# Patient Record
Sex: Male | Born: 1940 | Race: Black or African American | Hispanic: No | Marital: Single | Smoking: Current every day smoker
Health system: Southern US, Community
[De-identification: ages and names within clinical notes are randomized; demographics above are authoritative.]

## PROBLEM LIST (undated history)

## (undated) DIAGNOSIS — F32A Depression, unspecified: Secondary | ICD-10-CM

## (undated) DIAGNOSIS — K219 Gastro-esophageal reflux disease without esophagitis: Secondary | ICD-10-CM

## (undated) DIAGNOSIS — F0390 Unspecified dementia without behavioral disturbance: Secondary | ICD-10-CM

## (undated) DIAGNOSIS — I10 Essential (primary) hypertension: Secondary | ICD-10-CM

## (undated) DIAGNOSIS — F329 Major depressive disorder, single episode, unspecified: Secondary | ICD-10-CM

## (undated) HISTORY — PX: FACIAL COSMETIC SURGERY: SHX629

---

## 2016-07-19 ENCOUNTER — Emergency Department: Payer: Non-veteran care

## 2016-07-19 ENCOUNTER — Encounter: Payer: Self-pay | Admitting: Emergency Medicine

## 2016-07-19 ENCOUNTER — Emergency Department
Admission: EM | Admit: 2016-07-19 | Discharge: 2016-07-20 | Disposition: A | Discharge status: Home / Self Care | Payer: Non-veteran care | Attending: Emergency Medicine | Admitting: Emergency Medicine

## 2016-07-19 DIAGNOSIS — I10 Essential (primary) hypertension: Secondary | ICD-10-CM | POA: Insufficient documentation

## 2016-07-19 DIAGNOSIS — F17210 Nicotine dependence, cigarettes, uncomplicated: Secondary | ICD-10-CM | POA: Insufficient documentation

## 2016-07-19 DIAGNOSIS — R112 Nausea with vomiting, unspecified: Secondary | ICD-10-CM | POA: Insufficient documentation

## 2016-07-19 DIAGNOSIS — R55 Syncope and collapse: Secondary | ICD-10-CM | POA: Insufficient documentation

## 2016-07-19 HISTORY — DX: Major depressive disorder, single episode, unspecified: F32.9

## 2016-07-19 HISTORY — DX: Essential (primary) hypertension: I10

## 2016-07-19 HISTORY — DX: Depression, unspecified: F32.A

## 2016-07-19 HISTORY — DX: Gastro-esophageal reflux disease without esophagitis: K21.9

## 2016-07-19 HISTORY — DX: Unspecified dementia, unspecified severity, without behavioral disturbance, psychotic disturbance, mood disturbance, and anxiety: F03.90

## 2016-07-19 LAB — INFLUENZA PANEL BY PCR (TYPE A & B)
INFLAPCR: NEGATIVE
INFLBPCR: NEGATIVE

## 2016-07-19 LAB — CBC WITH DIFFERENTIAL/PLATELET
BASOS ABS: 0 10*3/uL (ref 0–0.1)
BASOS PCT: 1 %
EOS ABS: 0.1 10*3/uL (ref 0–0.7)
EOS PCT: 1 %
HCT: 37.1 % — ABNORMAL LOW (ref 40.0–52.0)
Hemoglobin: 12.2 g/dL — ABNORMAL LOW (ref 13.0–18.0)
LYMPHS PCT: 26 %
Lymphs Abs: 2.2 10*3/uL (ref 1.0–3.6)
MCH: 28.2 pg (ref 26.0–34.0)
MCHC: 32.9 g/dL (ref 32.0–36.0)
MCV: 85.6 fL (ref 80.0–100.0)
Monocytes Absolute: 0.7 10*3/uL (ref 0.2–1.0)
Monocytes Relative: 9 %
Neutro Abs: 5.3 10*3/uL (ref 1.4–6.5)
Neutrophils Relative %: 63 %
PLATELETS: 220 10*3/uL (ref 150–440)
RBC: 4.34 MIL/uL — AB (ref 4.40–5.90)
RDW: 13.1 % (ref 11.5–14.5)
WBC: 8.4 10*3/uL (ref 3.8–10.6)

## 2016-07-19 LAB — COMPREHENSIVE METABOLIC PANEL
ALBUMIN: 4 g/dL (ref 3.5–5.0)
ALT: 15 U/L — AB (ref 17–63)
AST: 22 U/L (ref 15–41)
Alkaline Phosphatase: 60 U/L (ref 38–126)
Anion gap: 5 (ref 5–15)
BUN: 16 mg/dL (ref 6–20)
CHLORIDE: 107 mmol/L (ref 101–111)
CO2: 27 mmol/L (ref 22–32)
CREATININE: 1.64 mg/dL — AB (ref 0.61–1.24)
Calcium: 8.4 mg/dL — ABNORMAL LOW (ref 8.9–10.3)
GFR calc Af Amer: 46 mL/min — ABNORMAL LOW (ref >60)
GFR calc non Af Amer: 40 mL/min — ABNORMAL LOW (ref >60)
Glucose, Bld: 107 mg/dL — ABNORMAL HIGH (ref 65–99)
Potassium: 3.8 mmol/L (ref 3.5–5.1)
SODIUM: 139 mmol/L (ref 135–145)
Total Bilirubin: 0.4 mg/dL (ref 0.3–1.2)
Total Protein: 7.6 g/dL (ref 6.5–8.1)

## 2016-07-19 LAB — URINALYSIS, COMPLETE (UACMP) WITH MICROSCOPIC
BACTERIA UA: NONE SEEN
BILIRUBIN URINE: NEGATIVE
Glucose, UA: NEGATIVE mg/dL
KETONES UR: NEGATIVE mg/dL
Nitrite: NEGATIVE
PH: 5 (ref 5.0–8.0)
Protein, ur: 30 mg/dL — AB
Specific Gravity, Urine: 1.014 (ref 1.005–1.030)

## 2016-07-19 LAB — TROPONIN I: Troponin I: 0.03 ng/mL (ref <0.03)

## 2016-07-19 LAB — LIPASE, BLOOD: Lipase: 33 U/L (ref 11–51)

## 2016-07-19 MED ORDER — SODIUM CHLORIDE 0.9 % IV BOLUS (SEPSIS)
1000.0000 mL | Freq: Once | INTRAVENOUS | Status: AC
  Administered 2016-07-19: 1000 mL via INTRAVENOUS

## 2016-07-19 MED ORDER — CLONIDINE HCL 0.1 MG PO TABS
ORAL_TABLET | ORAL | Status: AC
  Administered 2016-07-19: 0.1 mg via ORAL
  Filled 2016-07-19: qty 1

## 2016-07-19 MED ORDER — CLONIDINE HCL 0.1 MG PO TABS
0.1000 mg | ORAL_TABLET | Freq: Once | ORAL | Status: AC
  Administered 2016-07-19: 0.1 mg via ORAL

## 2016-07-19 MED ORDER — ONDANSETRON HCL 4 MG/2ML IJ SOLN
4.0000 mg | Freq: Once | INTRAMUSCULAR | Status: AC
  Administered 2016-07-19: 4 mg via INTRAVENOUS
  Filled 2016-07-19: qty 2

## 2016-07-19 NOTE — ED Provider Notes (Signed)
[facility] Emergency Department Provider Note  ____________________________________________   First MD Initiated Contact with Patient 07/19/16 2126     (approximate)  I have reviewed the triage vital signs and the nursing notes.   HISTORY  Chief Complaint Near Syncope   HPI Seth Harrison is a 76 y.o. male with a history of dementia and hypertension who is presenting after near syncopal episode and vomiting. The patient said that he was standing when he felt like he was going to pass out. He said that he fell on his bottom but is denying any pain. Denies any headache. Said that he vomited soon after. Does not report any blood in his vomitus. No diarrhea. Denies any pain at this time.   Past Medical History:  Diagnosis Date  . Dementia   . Depression   . GERD (gastroesophageal reflux disease)   . Hypertension     There are no active problems to display for this patient.   Past Surgical History:  Procedure Laterality Date  . FACIAL COSMETIC SURGERY      Prior to Admission medications   Not on File    Allergies Patient has no known allergies.  No family history on file.  Social History Social History  Substance Use Topics  . Smoking status: Current Every Day Smoker    Types: Cigarettes  . Smokeless tobacco: Never Used  . Alcohol use No    Review of Systems Constitutional: No fever/chills Eyes: No visual changes. ENT: No sore throat. Cardiovascular: Denies chest pain. Respiratory: Denies shortness of breath. Gastrointestinal: No abdominal pain.    No diarrhea.  No constipation. Genitourinary: Negative for dysuria. Musculoskeletal: Negative for back pain. Skin: Negative for rash. Neurological: Negative for headaches, focal weakness or numbness.  10-point ROS otherwise negative.  ____________________________________________   PHYSICAL EXAM:  VITAL SIGNS: ED Triage Vitals  Enc Vitals Group     BP 07/19/16 2132 (!)  205/112     Pulse Rate 07/19/16 2132 74     Resp 07/19/16 2132 17     Temp 07/19/16 2132 98 F (36.7 C)     Temp Source 07/19/16 2132 Oral     SpO2 07/19/16 2132 99 %     Weight 07/19/16 2126 160 lb (72.6 kg)     Height 07/19/16 2126 5\' 8"  (1.727 m)     Head Circumference --      Peak Flow --      Pain Score --      Pain Loc --      Pain Edu? --      Excl. in GC? --     Constitutional: Alert and oriented. Well appearing and in no acute distress. Eyes: Conjunctivae are normal. PERRL. EOMI. Head: Atraumatic. Nose: No congestion/rhinnorhea. Mouth/Throat: Mucous membranes are moist.   Neck: No stridor.   Cardiovascular: Normal rate, regular rhythm. Grossly normal heart sounds.   Respiratory: Normal respiratory effort.  No retractions. Lungs CTAB. Gastrointestinal: Soft and nontender. No distention. No abdominal bruits. No CVA tenderness. Musculoskeletal: No lower extremity tenderness nor edema.  No joint effusions. Neurologic:  Normal speech and language. No gross focal neurologic deficits are appreciated. No gait instability. Skin:  Skin is warm, dry and intact. No rash noted. Psychiatric:  Speech and behavior are normal.  ____________________________________________   LABS (all labs ordered are listed, but only abnormal results are displayed)  Labs Reviewed  CBC WITH DIFFERENTIAL/PLATELET - Abnormal; Notable for the following:       Result  Value   RBC 4.34 (*)    Hemoglobin 12.2 (*)    HCT 37.1 (*)    All other components within normal limits  COMPREHENSIVE METABOLIC PANEL - Abnormal; Notable for the following:    Glucose, Bld 107 (*)    Creatinine, Ser 1.64 (*)    Calcium 8.4 (*)    ALT 15 (*)    GFR calc non Af Amer 40 (*)    GFR calc Af Amer 46 (*)    All other components within normal limits  TROPONIN I - Abnormal; Notable for the following:    Troponin I 0.03 (*)    All other components within normal limits  URINALYSIS, COMPLETE (UACMP) WITH MICROSCOPIC -  Abnormal; Notable for the following:    Color, Urine YELLOW (*)    APPearance CLEAR (*)    Hgb urine dipstick SMALL (*)    Protein, ur 30 (*)    Leukocytes, UA TRACE (*)    Squamous Epithelial / LPF 0-5 (*)    All other components within normal limits  LIPASE, BLOOD  INFLUENZA PANEL BY PCR (TYPE A & B)  TROPONIN I   ____________________________________________  EKG  ED ECG REPORT I, Arelia Longest, the attending physician, personally viewed and interpreted this ECG.   Date: 07/19/2016  EKG Time: 2133  Rate: 62  Rhythm: normal sinus rhythm  Axis: normal axis  Intervals:none  ST&T Change: Minimal elevation in the anterior leads without any reciprocal depression. No T-wave inversions that are abnormal. No previous for comparison. ____________________________________________  RADIOLOGY  DG Chest 1 View (Accession [id number]) (Order [id number])  Imaging  Date: 07/19/2016 Department: [facility] EMERGENCY DEPARTMENT Released By/Authorizing: Myrna Blazer, MD (auto-released)  Exam Information   Status Exam Begun  Exam Ended   Final [99] 07/19/2016 9:37 PM 07/19/2016 9:45 PM  PACS Images   Show images for DG Chest 1 View  Study Result   CLINICAL DATA:  Near syncopal episode  EXAM: CHEST 1 VIEW  COMPARISON:  None.  FINDINGS: Minimal atelectasis left base. No acute consolidation. Heart size within normal limits for portable technique. No edema. No pneumothorax.  IMPRESSION: Minimal atelectasis left base. Otherwise no radiographic evidence for acute cardiopulmonary abnormality.   Electronically Signed   By: Jasmine Pang M.D.   On: 07/19/2016 22:04     ____________________________________________   PROCEDURES  Procedure(s) performed:   Procedures  Critical Care performed:   ____________________________________________   INITIAL IMPRESSION / ASSESSMENT AND PLAN / ED COURSE  Pertinent labs & imaging results  that were available during my care of the patient were reviewed by me and considered in my medical decision making (see chart for details).  ----------------------------------------- 11:05 PM on 07/19/2016 -----------------------------------------  Patient without any complaints. Mildly elevated troponin. No baseline labs or EKG for comparison. We will give patient a liter of fluids as well as Zofran. We will by mouth challenge. Plan is to draw a second troponin and if unchanged we will discharge to home. Unclear if patient took his evening blood pressure medications.  We'll give him a dose of clonidine. Signed out to Dr. York Cerise. Abdomen is soft and nontender. Unlikely to be acute abdominal process causing the vomiting. Possible viral cause with near syncope secondary to vagal episode.      ____________________________________________   FINAL CLINICAL IMPRESSION(S) / ED DIAGNOSES  Near syncope.  vomiting    NEW MEDICATIONS STARTED DURING THIS VISIT:  New Prescriptions   No medications on file  Note:  This document was prepared using Dragon voice recognition software and may include unintentional dictation errors.    Myrna Blazeravid Matthew Terrika Zuver, MD 07/19/16 [phone]

## 2016-07-19 NOTE — ED Notes (Signed)
Patient transported to CT 

## 2016-07-19 NOTE — ED Triage Notes (Signed)
Per [facility], patient comes from [facility]. Patient here after a near syncopal event. Patient states he was feeling dizzy. When EMS arrived patient was sitting on his bottom. EMS reports 1 episode of vomiting. Patient denies pain. Patient A&O x3, disoriented to time.

## 2016-07-19 NOTE — ED Provider Notes (Signed)
-----------------------------------------   11:57 PM on 07/19/2016 -----------------------------------------   Blood pressure (!) 195/111, pulse 71, temperature 98 F (36.7 C), temperature source Oral, resp. rate 12, height 5\' 8"  (1.727 m), weight 72.6 kg, SpO2 99 %.  Assuming care from Dr. Pershing ProudSchaevitz.  In short, Seth Harrison is a 76 y.o. male with a chief complaint of Near Syncope .  Refer to the original H&P for additional details.  The current plan of care is to follow up second troponin.      ----------------------------------------- 1:54 AM on 07/20/2016 -----------------------------------------  Second troponin is unchanged.  The patient continues to be in no acute distress and has no complaints.  I will discharge as per Dr. Pershing ProudSchaevitz recommendations.   Loleta Roseory Moxon Messler, MD 07/20/16 [phone]

## 2016-07-20 LAB — TROPONIN I: Troponin I: 0.03 ng/mL (ref <0.03)

## 2016-07-20 NOTE — ED Notes (Signed)
Left voice message at group home that patient ready to be picked up.

## 2016-07-20 NOTE — ED Notes (Signed)
Notified that patient's ride is on the way.

## 2017-04-29 ENCOUNTER — Observation Stay
Admission: EM | Admit: 2017-04-29 | Discharge: 2017-05-08 | Disposition: A | Discharge status: Home Health | Payer: Medicare Other | Attending: Internal Medicine | Admitting: Internal Medicine

## 2017-04-29 ENCOUNTER — Encounter: Payer: Self-pay | Admitting: Emergency Medicine

## 2017-04-29 DIAGNOSIS — Z7982 Long term (current) use of aspirin: Secondary | ICD-10-CM | POA: Insufficient documentation

## 2017-04-29 DIAGNOSIS — R609 Edema, unspecified: Secondary | ICD-10-CM

## 2017-04-29 DIAGNOSIS — R262 Difficulty in walking, not elsewhere classified: Secondary | ICD-10-CM | POA: Diagnosis not present

## 2017-04-29 DIAGNOSIS — K219 Gastro-esophageal reflux disease without esophagitis: Secondary | ICD-10-CM | POA: Insufficient documentation

## 2017-04-29 DIAGNOSIS — R52 Pain, unspecified: Secondary | ICD-10-CM

## 2017-04-29 DIAGNOSIS — Z79899 Other long term (current) drug therapy: Secondary | ICD-10-CM | POA: Insufficient documentation

## 2017-04-29 DIAGNOSIS — M549 Dorsalgia, unspecified: Secondary | ICD-10-CM | POA: Diagnosis not present

## 2017-04-29 DIAGNOSIS — E876 Hypokalemia: Secondary | ICD-10-CM | POA: Diagnosis not present

## 2017-04-29 DIAGNOSIS — N183 Chronic kidney disease, stage 3 (moderate): Secondary | ICD-10-CM | POA: Diagnosis not present

## 2017-04-29 DIAGNOSIS — Z66 Do not resuscitate: Secondary | ICD-10-CM | POA: Diagnosis not present

## 2017-04-29 DIAGNOSIS — I471 Supraventricular tachycardia: Principal | ICD-10-CM | POA: Insufficient documentation

## 2017-04-29 DIAGNOSIS — I129 Hypertensive chronic kidney disease with stage 1 through stage 4 chronic kidney disease, or unspecified chronic kidney disease: Secondary | ICD-10-CM | POA: Diagnosis not present

## 2017-04-29 DIAGNOSIS — F0390 Unspecified dementia without behavioral disturbance: Secondary | ICD-10-CM | POA: Insufficient documentation

## 2017-04-29 DIAGNOSIS — F17210 Nicotine dependence, cigarettes, uncomplicated: Secondary | ICD-10-CM | POA: Insufficient documentation

## 2017-04-29 DIAGNOSIS — R0600 Dyspnea, unspecified: Secondary | ICD-10-CM | POA: Diagnosis not present

## 2017-04-29 DIAGNOSIS — N179 Acute kidney failure, unspecified: Secondary | ICD-10-CM | POA: Diagnosis not present

## 2017-04-29 DIAGNOSIS — R531 Weakness: Secondary | ICD-10-CM | POA: Diagnosis not present

## 2017-04-29 LAB — CBC
HEMATOCRIT: 39.4 % — AB (ref 40.0–52.0)
HEMOGLOBIN: 12.9 g/dL — AB (ref 13.0–18.0)
MCH: 27.5 pg (ref 26.0–34.0)
MCHC: 32.7 g/dL (ref 32.0–36.0)
MCV: 84.2 fL (ref 80.0–100.0)
Platelets: 232 10*3/uL (ref 150–440)
RBC: 4.67 MIL/uL (ref 4.40–5.90)
RDW: 13.4 % (ref 11.5–14.5)
WBC: 11.4 10*3/uL — ABNORMAL HIGH (ref 3.8–10.6)

## 2017-04-29 NOTE — ED Triage Notes (Signed)
Pt presents to ED 16 from [facility] via EMS c/o weakness that, according to the patient, started since yesterday. Pt is awake, alert and oriented x4. Pt does not c/o any pain, no nausea, vomiting, or diarrhea. Pt does have expository wheezes throughout all fields bilaterally. Pt's O2 sats are at 98% on RA at this time. Pt has elevated BP of 193/102 at this time.

## 2017-04-30 ENCOUNTER — Emergency Department: Payer: Medicare Other

## 2017-04-30 LAB — URINALYSIS, COMPLETE (UACMP) WITH MICROSCOPIC
BACTERIA UA: NONE SEEN
BILIRUBIN URINE: NEGATIVE
Glucose, UA: NEGATIVE mg/dL
Ketones, ur: NEGATIVE mg/dL
LEUKOCYTES UA: NEGATIVE
NITRITE: NEGATIVE
PH: 5 (ref 5.0–8.0)
Protein, ur: 30 mg/dL — AB
Specific Gravity, Urine: 1.025 (ref 1.005–1.030)

## 2017-04-30 LAB — BASIC METABOLIC PANEL
ANION GAP: 9 (ref 5–15)
BUN: 24 mg/dL — ABNORMAL HIGH (ref 6–20)
CALCIUM: 8.8 mg/dL — AB (ref 8.9–10.3)
CO2: 25 mmol/L (ref 22–32)
CREATININE: 1.85 mg/dL — AB (ref 0.61–1.24)
Chloride: 102 mmol/L (ref 101–111)
GFR, EST AFRICAN AMERICAN: 39 mL/min — AB (ref >60)
GFR, EST NON AFRICAN AMERICAN: 34 mL/min — AB (ref >60)
Glucose, Bld: 92 mg/dL (ref 65–99)
Potassium: 3.8 mmol/L (ref 3.5–5.1)
Sodium: 136 mmol/L (ref 135–145)

## 2017-04-30 MED ORDER — ISOSORBIDE MONONITRATE ER 30 MG PO TB24
30.0000 mg | ORAL_TABLET | Freq: Every day | ORAL | Status: DC
  Administered 2017-04-30 – 2017-05-03 (×4): 30 mg via ORAL
  Filled 2017-04-30 (×4): qty 1

## 2017-04-30 MED ORDER — CLONIDINE HCL 0.1 MG PO TABS
0.1000 mg | ORAL_TABLET | Freq: Once | ORAL | Status: AC
Start: 2017-04-30 — End: 2017-04-30
  Administered 2017-04-30: 0.1 mg via ORAL
  Filled 2017-04-30: qty 1

## 2017-04-30 MED ORDER — LISINOPRIL 20 MG PO TABS
20.0000 mg | ORAL_TABLET | Freq: Every day | ORAL | Status: DC
  Administered 2017-04-30 – 2017-05-02 (×3): 20 mg via ORAL
  Filled 2017-04-30: qty 1
  Filled 2017-04-30 (×2): qty 2

## 2017-04-30 MED ORDER — ADULT MULTIVITAMIN W/MINERALS CH
1.0000 | ORAL_TABLET | Freq: Every day | ORAL | Status: DC
  Administered 2017-04-30 – 2017-05-08 (×9): 1 via ORAL
  Filled 2017-04-30 (×9): qty 1

## 2017-04-30 MED ORDER — ASPIRIN 81 MG PO CHEW
CHEWABLE_TABLET | ORAL | Status: AC
  Administered 2017-04-30: 324 mg
  Filled 2017-04-30: qty 4

## 2017-04-30 MED ORDER — BACLOFEN 10 MG PO TABS
10.0000 mg | ORAL_TABLET | Freq: Two times a day (BID) | ORAL | Status: DC | PRN
  Filled 2017-04-30: qty 1

## 2017-04-30 MED ORDER — ACETAMINOPHEN 325 MG PO TABS: 650.0000 mg | ORAL_TABLET | Freq: Three times a day (TID) | ORAL | Status: DC | PRN

## 2017-04-30 MED ORDER — PANTOPRAZOLE SODIUM 40 MG PO TBEC
40.0000 mg | DELAYED_RELEASE_TABLET | Freq: Every day | ORAL | Status: DC
  Administered 2017-04-30 – 2017-05-08 (×9): 40 mg via ORAL
  Filled 2017-04-30 (×9): qty 1

## 2017-04-30 MED ORDER — VITAMIN D 1000 UNITS PO TABS
1000.0000 [IU] | ORAL_TABLET | Freq: Every day | ORAL | Status: DC
  Administered 2017-04-30 – 2017-05-08 (×9): 1000 [IU] via ORAL
  Filled 2017-04-30 (×9): qty 1

## 2017-04-30 MED ORDER — ASPIRIN EC 325 MG PO TBEC
325.0000 mg | DELAYED_RELEASE_TABLET | Freq: Every day | ORAL | Status: DC
  Administered 2017-05-01 – 2017-05-08 (×8): 325 mg via ORAL
  Filled 2017-04-30 (×8): qty 1

## 2017-04-30 MED ORDER — VITAMIN B-12 1000 MCG PO TABS
1000.0000 ug | ORAL_TABLET | Freq: Every day | ORAL | Status: DC
  Administered 2017-04-30 – 2017-05-08 (×9): 1000 ug via ORAL
  Filled 2017-04-30 (×10): qty 1

## 2017-04-30 MED ORDER — CARVEDILOL 6.25 MG PO TABS
3.1250 mg | ORAL_TABLET | Freq: Two times a day (BID) | ORAL | Status: DC
  Administered 2017-05-01: 3.125 mg via ORAL
  Filled 2017-04-30 (×2): qty 1

## 2017-04-30 MED ORDER — MIRTAZAPINE 15 MG PO TABS
15.0000 mg | ORAL_TABLET | Freq: Every day | ORAL | Status: DC
  Administered 2017-04-30 – 2017-05-07 (×8): 15 mg via ORAL
  Filled 2017-04-30 (×8): qty 1

## 2017-04-30 MED ORDER — DONEPEZIL HCL 5 MG PO TABS
10.0000 mg | ORAL_TABLET | Freq: Every day | ORAL | Status: DC
  Administered 2017-04-30 – 2017-05-07 (×8): 10 mg via ORAL
  Filled 2017-04-30 (×4): qty 2
  Filled 2017-04-30: qty 1
  Filled 2017-04-30 (×5): qty 2

## 2017-04-30 NOTE — ED Notes (Addendum)
[facility], [facility] caretaker at group home called this RN.  She states that yesterday patient was having increased difficulty walking and was having a lot of difficulty walking to use the bathroom.  She states patient often has some difficulty walking but not nearly as much as yesterday.  She states patient used to get physical therapy but it was discontinued because "they said they did all they could do."

## 2017-04-30 NOTE — ED Provider Notes (Signed)
[facility]  I accepted care from Dr. Manson Passey ____________________________________________    LABS (pertinent positives/negatives)  I, Seth Rooks, MD have personally reviewed the lab reports noted below.  Labs Reviewed  BASIC METABOLIC PANEL - Abnormal; Notable for the following:       Result Value   BUN 24 (*)    Creatinine, Ser 1.85 (*)    Calcium 8.8 (*)    GFR calc non Af Amer 34 (*)    GFR calc Af Amer 39 (*)    All other components within normal limits  CBC - Abnormal; Notable for the following:    WBC 11.4 (*)    Hemoglobin 12.9 (*)    HCT 39.4 (*)    All other components within normal limits  URINALYSIS, COMPLETE (UACMP) WITH MICROSCOPIC - Abnormal; Notable for the following:    Color, Urine YELLOW (*)    APPearance CLEAR (*)    Hgb urine dipstick SMALL (*)    Protein, ur 30 (*)    Squamous Epithelial / LPF 0-5 (*)    All other components within normal limits  BASIC METABOLIC PANEL - Abnormal; Notable for the following:    Glucose, Bld 115 (*)    BUN 25 (*)    Creatinine, Ser 1.89 (*)    Calcium 8.6 (*)    GFR calc non Af Amer 33 (*)    GFR calc Af Amer 38 (*)    All other components within normal limits  CBC WITH DIFFERENTIAL/PLATELET - Abnormal; Notable for the following:    Hemoglobin 12.8 (*)    HCT 39.5 (*)    All other components within normal limits  TROPONIN I - Abnormal; Notable for the following:    Troponin I 0.04 (*)    All other components within normal limits  MRSA PCR SCREENING  MAGNESIUM  PHOSPHORUS  TSH  BASIC METABOLIC PANEL  CBC  CBG MONITORING, ED    Imaging   Femur xray:  IMPRESSION: No acute bony abnormality.  Metallic foreign bodies within the soft tissues along the anterior knee.  Mild left hip degenerative changes, advanced left knee degenerative changes.  Pelvis:  IMPRESSION: There is no acute bony abnormality of the pelvis. There is mild symmetric narrowing of the hip joint spaces compatible  with osteoarthritis.  CT head without:  IMPRESSION: 1. No acute finding or change from January 2018. 2. 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  ____________________________________________   PROCEDURES  Procedure(s) performed: None  Critical Care performed: None  ____________________________________________   INITIAL IMPRESSION / ASSESSMENT AND PLAN / ED COURSE   Pertinent labs & imaging results that were available during my care of the patient were reviewed by me and considered in my medical decision making (see chart for details).  Urinalysis was pending, result is negative for signs of urinary tract infection.  No clear medical inciting factor for behavioral disturbance associated with dementia.  Social work to evaluate whether or not patient can go back to group care versus higher level of nursing care.   Nurse spoke with care home staff -- had noted patient having trouble walking over past few days.  No trauma reported. On exam mild left knee, lower femur discomfort -- will xray femur and pelvis and check head CT to be thorough in evaluation for somewhat acute generalized weakness.    No high suspician for acute stroke, not recommending MRI at this point with reassuring non-acute head CT.  CONSULTATIONS: Social work.  Patient / Family / Caregiver informed of clinical course, medical decision-making process, and agree with plan.    ____________________________________________   FINAL CLINICAL IMPRESSION(S) / ED DIAGNOSES  Final diagnoses:  Weakness  Dementia without behavioral disturbance, unspecified dementia type  SVT (supraventricular tachycardia) (HCC)  Dyspnea, unspecified type        Seth RooksLord, Seth Brugh, MD 05/02/17 2116

## 2017-04-30 NOTE — ED Notes (Addendum)
Pt given sandwich and helped with repositioning. Pt in NAD. Pt eating with no difficulty. Nurse okayed with Dr. Shaune PollackLord

## 2017-04-30 NOTE — ED Notes (Signed)
Pts group home in ED treatment room to take patient home. Pt dressed, IV removed and discharge papers explained. Pt attempted to stand and ambulate with walker. PT would not stand straight and would not ambulate properly with walker. Pt stumbled once and needed staff assist to stabilize self. Staff reports they do not feel safe taking patient home and RN agrees. Pt unable to ambulate with walker or with assistance. MD made aware. Pt assisted by three RN's back into bed and changed. New bed sheets, new gown and new diaper provided. Group ome staff has left ED and reports they will call back in the morning for an update.

## 2017-04-30 NOTE — ED Notes (Signed)
Pt in NAD at this time. Pt is sleeping on stretcher.

## 2017-04-30 NOTE — ED Notes (Signed)
Nurse spoke with social worker about pt's possible placement. Social worker stating that she will be speaking to pt's family about placement due to pt's insurance not covering for long term care.

## 2017-04-30 NOTE — ED Notes (Signed)
Performed passive range of motion with patient's left leg.  Patient is complaining of left knee pain.  Patient is able to lift and move right leg on his own.  Patient appears tired and is not wanting to sit up or change positions much in bed.  Patient is able to use the urinal without assistance.

## 2017-04-30 NOTE — ED Notes (Signed)
Patient is asleep on the stretcher. Rise and fall of the chest noted. Patient is easily aroused when spoken to. Patient does not appear to be in acute distress at this time.   

## 2017-04-30 NOTE — ED Notes (Signed)
Pt given meal tray and eating at this time. PT in NAD and able to eat independently.

## 2017-04-30 NOTE — ED Notes (Signed)
Pt able to stand and pivot with assistance into new inpatient bed. PT cleaned and changed and provided with new blankets. Pt sleeping with TV on at this time.

## 2017-04-30 NOTE — ED Notes (Signed)
Group Home Number: [phone]

## 2017-04-30 NOTE — Clinical Social Work Note (Addendum)
CSW reconsulted for "Patient cannot walk and therefore cannot go back to the group home." CSW again spoke with Elliot CousinMarlette Harrison [phone]) of [facility] to find out if pt has any other insurance and she stated she is not sure. Ms. Seth Harrison stated pt is a veteran, but would need to [facility] to find out about insurance and income for pt.   6:11pm - CSW called Ms. Seth Harrison gain to follow up on pt insurance status. Ms. Seth Harrison states pt does not have any other insurance and is using his disability to pay for room and board. Ms. Seth Harrison does not how much. CSW asked Ms. Seth Harrison to have owner call CSW as pt information is needed to try to place. Ms. Seth Harrison agreed. CSW awaiting call from owner. DR. Mayford KnifeWilliams updated about pt not having a payor source. CSW staffed case with Social Work Asst. Medical laboratory scientific officerDirector Seth Harrison. CSW continuing to follow for discharge needs.   Corlis HoveJeneya Damien Harrison, Theresia MajorsLCSWA, [facility] Clinical Social Worker-ED [phone]

## 2017-04-30 NOTE — Clinical Social Work Note (Signed)
CSW received a consult for "Patient requiring placement." CSW met with pt at bedside. Pt with history of dementia. Pt states he is agreeabel to return to his current group home, as he does not have a payor source for a skilled nursing facility. Pt states he just wants to "get out of here so I can smoke me a cigarette."  CSW spoke with Cherly Anderson [phone]) owner of [place] group home and explained pt cannot be placed in SNF without a payor source and would need to return to the group home. Ms. Armandina Gemma expressed understanding and stated she would be coming shortly to pick up pt. CSW updated RN and MD. Overly signing off as no further Social Work needs identified.   Seth Harrison, Seth Harrison, [place] Social Worker-ED [phone]

## 2017-04-30 NOTE — ED Provider Notes (Signed)
[facility] Emergency Department Provider Note    First MD Initiated Contact with Patient 04/29/17 2339     (approximate)  I have reviewed the triage vital signs and the nursing notes.   HISTORY  Chief Complaint Weakness   HPI Jermale Crass is a 76 y.o. male with below list of chronic medical conditions presents via EMS from [facility] with complaint of generalized weakness. Per the patient he has had progressive weakness since yesterday. Patient denies any pain no nausea vomiting or diarrhea. Patient denies any cough or fever. Patient denies any urinary symptoms.   Past Medical History:  Diagnosis Date  . Dementia   . Depression   . GERD (gastroesophageal reflux disease)   . Hypertension     There are no active problems to display for this patient.   Past Surgical History:  Procedure Laterality Date  . FACIAL COSMETIC SURGERY      Prior to Admission medications   Not on File    Allergies No known drug allergies History reviewed. No pertinent family history.  Social History Social History  Substance Use Topics  . Smoking status: Current Every Day Smoker    Packs/day: 1.00    Types: Cigarettes  . Smokeless tobacco: Never Used  . Alcohol use No    Review of Systems Constitutional: No fever/chills Eyes: No visual changes. ENT: No sore throat. Cardiovascular: Denies chest pain. Respiratory: Denies shortness of breath. Gastrointestinal: No abdominal pain.  No nausea, no vomiting.  No diarrhea.  No constipation. Genitourinary: Negative for dysuria. Musculoskeletal: Negative for neck pain.  Negative for back pain. Integumentary: Negative for rash. Neurological: Negative for headaches, focal weakness or numbness. Positive for generalized weakness   ____________________________________________   PHYSICAL EXAM:  VITAL SIGNS: ED Triage Vitals  Enc Vitals Group     BP 04/29/17 2338 (!) 193/102     Pulse Rate  04/29/17 2338 81     Resp 04/29/17 2338 17     Temp 04/29/17 2338 99.4 F (37.4 C)     Temp Source 04/29/17 2338 Oral     SpO2 04/29/17 2337 97 %     Weight 04/29/17 2342 79.4 kg (175 lb)     Height 04/29/17 2342 1.753 m (5\' 9" )     Head Circumference --      Peak Flow --      Pain Score --      Pain Loc --      Pain Edu? --      Excl. in GC? --     Constitutional: Alert and oriented. Well appearing and in no acute distress. Eyes: Conjunctivae are normal. PERRL. EOMI. Head: Atraumatic. Mouth/Throat: Mucous membranes are moist.  Oropharynx non-erythematous. Neck: No stridor.   Cardiovascular: Normal rate, regular rhythm. Good peripheral circulation. Grossly normal heart sounds. Respiratory: Normal respiratory effort.  No retractions. Lungs CTAB. Gastrointestinal: Soft and nontender. No distention.  Musculoskeletal: No lower extremity tenderness nor edema. No gross deformities of extremities. Neurologic:  Normal speech and language. No gross focal neurologic deficits are appreciated.  Skin:  Skin is warm, dry and intact. No rash noted. Psychiatric: Mood and affect are normal. Speech and behavior are normal.  ____________________________________________   LABS (all labs ordered are listed, but only abnormal results are displayed)  Labs Reviewed  CBC - Abnormal; Notable for the following:       Result Value   WBC 11.4 (*)    Hemoglobin 12.9 (*)    HCT  39.4 (*)    All other components within normal limits  BASIC METABOLIC PANEL  URINALYSIS, COMPLETE (UACMP) WITH MICROSCOPIC  CBG MONITORING, ED   ____________________________________________  EKG  ED ECG REPORT I, Monroe N BROWN, the attending physician, personally viewed and interpreted this ECG.   Date: 04/30/2017  EKG Time: 11:36 PM  Rate: 82  Rhythm: normal sinus rhythm with premature ventricular contraction  Axis: normal  Intervals:normal  ST&T Change:  none    Procedures   ____________________________________________   INITIAL IMPRESSION / ASSESSMENT AND PLAN / ED COURSE  As part of my medical decision making, I reviewed the following data within the electronic MEDICAL RECORD NUMBER 76 year old male presenting with above stated history of weakness. Laboratory data notable for creatinineof 1.85with a BUN of 24 comparison data from 07/19/2016 BUN 16 with a creatinine of 1.64. No clear etiology for the patient's generalized weakness noted at this time urinalysis pending. I was informed by EMS to the group home states that they're unable to care for the patient as he would require higher level of care and a such social work consult placed. Patient's care transferred to Dr. Shaune PollackLord     ____________________________________________  FINAL CLINICAL IMPRESSION(S) / ED DIAGNOSES  Final diagnoses:  Weakness  Dementia without behavioral disturbance, unspecified dementia type     MEDICATIONS GIVEN DURING THIS VISIT:  Medications - No data to display   NEW OUTPATIENT MEDICATIONS STARTED DURING THIS VISIT:  New Prescriptions   No medications on file    Modified Medications   No medications on file    Discontinued Medications   No medications on file     Note:  This document was prepared using Dragon voice recognition software and may include unintentional dictation errors.    Darci CurrentBrown, [place] N, MD 04/30/17 [id number]

## 2017-04-30 NOTE — ED Notes (Signed)
Patient transported to CT 

## 2017-05-01 NOTE — ED Notes (Signed)
Informed Seth Harrison CSW of PT recommendations and states they are working on his case for placement, but are having issues since there is no form of payment at this time.

## 2017-05-01 NOTE — ED Notes (Signed)
Pt given meal tray.

## 2017-05-01 NOTE — Clinical Social Work Note (Signed)
CSW spoke with patient's group home care provider Elliot CousinMarlette Golden [phone] and they would like patient to go to SNF before he is able to return home.  Patient has not had a qualifying stay, patient will have to be private pay.  CSW is awaiting bed offers, CSW attempted to contact [facility] Group home owner [phone] and left message for her to call CSW back about paying for stay at SNF.  CSW continuing to follow patient's progress and awaiting bed offers.  Ervin KnackEric R. Hassan Rowannterhaus, MSW, Theresia MajorsLCSWA [phone]  05/01/2017 6:39 PM

## 2017-05-01 NOTE — ED Notes (Signed)
Report off to kendall rn  

## 2017-05-01 NOTE — ED Notes (Signed)
Resumed care from stephanie rn.  Pt alert.  Denies pain.  Skin warm and dry.  Pt answers questions appro.

## 2017-05-01 NOTE — ED Provider Notes (Signed)
Patient rested well overnight but did have one episode where he woke up and vomited once, he reported that this happens to him from time to time seemingly randomly.  Not reporting any pain.  No trouble breathing.  Denies any trouble breathing, resting presently with 97% room air saturation..  No hypoxia, no complaints of increased work of breathing.  Do not see indication for chest x-ray at this time unless patient were to become symptomatic.  Ongoing care transferred to Dr. Derrill KayGoodman, we did discuss the low in episode of vomiting which occurred earlier as well.   Sharyn CreamerQuale, Anddy Wingert, MD 05/01/17 [phone]

## 2017-05-01 NOTE — ED Notes (Signed)
Pt up to br with assistance.  

## 2017-05-01 NOTE — ED Notes (Signed)
Pt eating dinner tray  Pt alert.   

## 2017-05-01 NOTE — ED Notes (Signed)
meds given.  Pt awake

## 2017-05-01 NOTE — Evaluation (Signed)
Physical Therapy Evaluation Patient Details Name: Seth Harrison MRN: [id number] DOB: 07/10/1940 Today's Date: 05/01/2017   History of Present Illness  Pt is [name] who presented via EMS from [facility] group home with generalized weakness.  No clear etiology of pt's generalized weakness.  Pt's PMH includes dementia.      Clinical Impression  Pt admitted with above diagnosis. Pt currently with functional limitations due to the deficits listed below (see PT Problem List). Mr. Seth Harrison presents with generalized weakness and is quick to fatigue with all aspects of mobility.  He demonstrates significantly flexed trunk in standing position that worsens with any stepping activity despite max verbal and tactile cues for upright posture.  Therefore, he is at a high risk of falling.  He requires min assist to boost to standing from sitting and mod assist for stepping at bedside.  Given pt's current mobility status, recommending SNF at d/c.  Pt will benefit from skilled PT to increase their independence and safety with mobility to allow discharge to the venue listed below.      Follow Up Recommendations SNF    Equipment Recommendations  None recommended by PT    Recommendations for Other Services       Precautions / Restrictions Precautions Precautions: Fall Restrictions Weight Bearing Restrictions: No      Mobility  Bed Mobility Overal bed mobility: Needs Assistance Bed Mobility: Supine to Sit     Supine to sit: Min guard;HOB elevated     General bed mobility comments: Pt requires increased time and effort.  He demonstrates difficulty elevating his trunk due to tendency for posterior lean and requires cues to advance trunk forward to achieve upright.   Transfers Overall transfer level: Needs assistance Equipment used: Rolling walker (2 wheeled) Transfers: Sit to/from Stand Sit to Stand: Min assist         General transfer comment: Cues for proper hand placement as pt has  tendency to reach for RW with BUEs.  Min assist to boost to standing and to steady as pt demonstrates significantly flexed posture which improves only temporarily with verbal and tactile cues.   Ambulation/Gait Ambulation/Gait assistance: Mod assist Ambulation Distance (Feet): 3 Feet Assistive device: Rolling walker (2 wheeled) Gait Pattern/deviations: Shuffle;Trunk flexed     General Gait Details: Pt with moderately flexed posture in static standing that automatically advances to significantly flexed posture with pt's trunk almost parallel to the floor.  Despite max verbal cues and tactile cues pt continues to assume flexed position with any stepping activity.  Even attempted to have pt look up at this PTs hand to identify how many fingers held up and pt still demonstrated significantly flexed posture.    Stairs            Wheelchair Mobility    Modified Rankin (Stroke Patients Only)       Balance Overall balance assessment: Needs assistance Sitting-balance support: No upper extremity supported;Feet supported Sitting balance-Leahy Scale: Fair Sitting balance - Comments: Intermittent posterior lean sitting EOB with cues required to encourage pt to advance trunk forward Postural control: Posterior lean Standing balance support: Bilateral upper extremity supported;During functional activity Standing balance-Leahy Scale: Poor Standing balance comment: Pt relies on BUE support and physical assist for static and dynamic activities                             Pertinent Vitals/Pain Pain Assessment: Faces Faces Pain Scale: Hurts little  more Pain Location: back Pain Descriptors / Indicators: Aching;Discomfort Pain Intervention(s): Limited activity within patient's tolerance;Monitored during session;Repositioned    Home Living Family/patient expects to be discharged to:: Skilled nursing facility Living Arrangements: Group Home   Type of Home: Group Home          Home Equipment: Dan Humphreys - 2 wheels;Cane - single point      Prior Function Level of Independence: Independent with assistive device(s)         Comments: Pt typically can ambulate household distances in group home but does say, "I get tired quickly" and requires seated rest breaks when ambulating in group home.  Pt reports he typically is independent standing and showering, dressing.  Food and cleaning is provided by group home.  Per chart review, it appears that pt has been requiring more assist than usual due to generalized weakness.       Hand Dominance   Dominant Hand: Left    Extremity/Trunk Assessment   Upper Extremity Assessment Upper Extremity Assessment:  (BUE strength grossly 4/5)    Lower Extremity Assessment Lower Extremity Assessment:  (BLE strength grossly 4-/5)    Cervical / Trunk Assessment Cervical / Trunk Assessment: Kyphotic  Communication   Communication: No difficulties (pt does mumble at times, making it difficult to understand)  Cognition Arousal/Alertness: Awake/alert (Initially sleeping and lethargic) Behavior During Therapy: Flat affect Overall Cognitive Status: No family/caregiver present to determine baseline cognitive functioning                                 General Comments: Pt has delayed responses at times.        General Comments General comments (skin integrity, edema, etc.): SpO2 remained at or above 98% on RA throughout session.     Exercises General Exercises - Lower Extremity Long Arc Quad: AROM;Both;10 reps;Seated Hip Flexion/Marching: AROM;Both;10 reps;Seated   Assessment/Plan    PT Assessment Patient needs continued PT services  PT Problem List Decreased strength;Decreased activity tolerance;Decreased balance;Decreased mobility;Decreased cognition;Decreased knowledge of use of DME;Decreased safety awareness       PT Treatment Interventions DME instruction;Gait training;Stair training;Functional mobility  training;Therapeutic activities;Therapeutic exercise;Balance training;Neuromuscular re-education;Cognitive remediation;Patient/family education    PT Goals (Current goals can be found in the Care Plan section)  Acute Rehab PT Goals Patient Stated Goal: to get stronger PT Goal Formulation: With patient Time For Goal Achievement: 05/15/17 Potential to Achieve Goals: Fair    Frequency Min 2X/week   Barriers to discharge Decreased caregiver support Per chart review, group home unable to meet physical needs of pt    Co-evaluation               AM-PAC PT "6 Clicks" Daily Activity  Outcome Measure Difficulty turning over in bed (including adjusting bedclothes, sheets and blankets)?: Unable Difficulty moving from lying on back to sitting on the side of the bed? : Unable Difficulty sitting down on and standing up from a chair with arms (e.g., wheelchair, bedside commode, etc,.)?: Unable Help needed moving to and from a bed to chair (including a wheelchair)?: A Lot Help needed walking in hospital room?: A Lot Help needed climbing 3-5 steps with a railing? : Total 6 Click Score: 8    End of Session Equipment Utilized During Treatment: Gait belt Activity Tolerance: Patient limited by fatigue;Patient tolerated treatment well Patient left: in bed;with call bell/phone within reach;with bed alarm set Nurse Communication: Mobility status;Other (comment) (SpO2) PT  Visit Diagnosis: Unsteadiness on feet (R26.81);Muscle weakness (generalized) (M62.81);Other abnormalities of gait and mobility (R26.89);Difficulty in walking, not elsewhere classified (R26.2)    Time: 4098-1191 PT Time Calculation (min) (ACUTE ONLY): 21 min   Charges:   PT Evaluation $PT Eval Low Complexity: 1 Low     PT G Codes:   PT G-Codes **NOT FOR INPATIENT CLASS** Functional Assessment Tool Used: AM-PAC 6 Clicks Basic Mobility;Clinical judgement Functional Limitation: Mobility: Walking and moving around Mobility:  Walking and Moving Around Current Status (Y7829): At least 80 percent but less than 100 percent impaired, limited or restricted Mobility: Walking and Moving Around Goal Status [phone]): At least 40 percent but less than 60 percent impaired, limited or restricted   Encarnacion Chu PT, DPT 05/01/2017, 9:16 AM

## 2017-05-01 NOTE — ED Notes (Signed)
RN entered room and pt had vomit around face. Pt reports he sometimes vomits while sleeping and usually takes a pill at home to help. Unsure if pt aspirated on vomit. Pt has increased work of breathing when rolling in bed to assist staff with changing depends and bed sheets. MD made aware. Pts oxygen saturation remains within normal limits. PT is coughing at this time but verbalized this is also common for him at night.

## 2017-05-01 NOTE — ED Notes (Signed)
PT in with patient at the present for eval.

## 2017-05-01 NOTE — ED Notes (Signed)
Pt sitting up at the bedside eating lunch at present.

## 2017-05-01 NOTE — NC FL2 (Signed)
[place] MEDICAID FL2 LEVEL OF CARE SCREENING TOOL     IDENTIFICATION  Patient Name: Seth Harrison Birthdate: 1940-08-13 Sex: male Admission Date (Current Location): 04/29/2017  [place] Number:  Chiropodist and Address:  [facility], [address] [place], [place] [address]      Provider Number: [id number]  Attending Physician Name and Address:  No att. providers found  Relative Name and Phone Number:  Danae Orleans [phone]     Current Level of Care: Hospital Recommended Level of Care: Skilled Nursing Facility Prior Approval Number:    Date Approved/Denied:   PASRR Number: [id number]  Discharge Plan: SNF    Current Diagnoses: There are no active problems to display for this patient.   Orientation RESPIRATION BLADDER Height & Weight     Self  Normal Continent Weight: 175 lb (79.4 kg) Height:  5\' 9"  (175.3 cm)  BEHAVIORAL SYMPTOMS/MOOD NEUROLOGICAL BOWEL NUTRITION STATUS      Continent Diet (Cardiac diet)  AMBULATORY STATUS COMMUNICATION OF NEEDS Skin   Limited Assist Verbally Normal                       Personal Care Assistance Level of Assistance  Bathing, Feeding, Dressing Bathing Assistance: Limited assistance Feeding assistance: Independent Dressing Assistance: Limited assistance     Functional Limitations Info  Sight, Hearing, Speech Sight Info: Adequate Hearing Info: Adequate Speech Info: Adequate    SPECIAL CARE FACTORS FREQUENCY        PT Frequency: 5x a week              Contractures Contractures Info: Not present    Additional Factors Info  Code Status, Allergies Code Status Info: Full Code Allergies Info: NKA           Current Medications (05/01/2017):  This is the current hospital active medication list Current Facility-Administered Medications  Medication Dose Route Frequency Provider Last Rate Last Dose  . acetaminophen (TYLENOL) tablet 650 mg  650 mg  Oral Q8H PRN Willy Eddy, MD      . aspirin EC tablet 325 mg  325 mg Oral Daily Willy Eddy, MD   325 mg at 05/01/17 1003  . baclofen (LIORESAL) tablet 10 mg  10 mg Oral Q12H PRN Willy Eddy, MD      . carvedilol (COREG) tablet 3.125 mg  3.125 mg Oral BID WC Willy Eddy, MD   3.125 mg at 05/01/17 0805  . cholecalciferol (VITAMIN D) tablet 1,000 Units  1,000 Units Oral Daily Willy Eddy, MD   1,000 Units at 05/01/17 1003  . donepezil (ARICEPT) tablet 10 mg  10 mg Oral QHS Willy Eddy, MD   10 mg at 04/30/17 2219  . isosorbide mononitrate (IMDUR) 24 hr tablet 30 mg  30 mg Oral Daily Willy Eddy, MD   30 mg at 05/01/17 1003  . lisinopril (PRINIVIL,ZESTRIL) tablet 20 mg  20 mg Oral Daily Willy Eddy, MD   20 mg at 05/01/17 1003  . mirtazapine (REMERON) tablet 15 mg  15 mg Oral QHS Willy Eddy, MD   15 mg at 04/30/17 2219  . multivitamin with minerals tablet 1 tablet  1 tablet Oral Daily Willy Eddy, MD   1 tablet at 05/01/17 1003  . pantoprazole (PROTONIX) EC tablet 40 mg  40 mg Oral Daily Willy Eddy, MD   40 mg at 05/01/17 1003  . vitamin B-12 (CYANOCOBALAMIN) tablet 1,000 mcg  1,000 mcg Oral Daily Willy Eddy, MD  1,000 mcg at 05/01/17 1003   Current Outpatient Prescriptions  Medication Sig Dispense Refill  . acetaminophen (TYLENOL) 650 MG CR tablet Take 650 mg by mouth every 8 (eight) hours as needed for pain.    Marland Kitchen. aspirin 325 MG tablet Take 325 mg by mouth daily.    . baclofen (LIORESAL) 10 MG tablet Take 10 mg by mouth every 12 (twelve) hours as needed for muscle spasms.    . carvedilol (COREG) 3.125 MG tablet Take 3.125 mg by mouth 2 (two) times daily with a meal.    . cholecalciferol (VITAMIN D) 1000 units tablet Take 1,000 Units by mouth daily.    Marland Kitchen. donepezil (ARICEPT) 10 MG tablet Take 10 mg by mouth at bedtime.    . isosorbide mononitrate (IMDUR) 30 MG 24 hr tablet Take 30 mg by mouth daily.    Marland Kitchen. lisinopril  (PRINIVIL,ZESTRIL) 20 MG tablet Take 20 mg by mouth daily.    . mirtazapine (REMERON) 15 MG tablet Take 15 mg by mouth at bedtime.    . Multiple Vitamins-Minerals (THEREMS-M) TABS Take 1 tablet by mouth daily.    Marland Kitchen. omeprazole (PRILOSEC) 40 MG capsule Take 40 mg by mouth daily.    . vitamin B-12 (CYANOCOBALAMIN) 1000 MCG tablet Take 1,000 mcg by mouth daily.       Discharge Medications: Please see discharge summary for a list of discharge medications.  Relevant Imaging Results:  Relevant Lab Results:   Additional Information SSN [id number]  Darleene Cleavernterhaus, Darreon Lutes R, [place]

## 2017-05-02 ENCOUNTER — Encounter: Payer: Self-pay | Admitting: Internal Medicine

## 2017-05-02 ENCOUNTER — Emergency Department: Payer: Medicare Other

## 2017-05-02 ENCOUNTER — Observation Stay (HOSPITAL_BASED_OUTPATIENT_CLINIC_OR_DEPARTMENT_OTHER)
Admission: EM | Admit: 2017-05-02 | Discharge: 2017-05-02 | Disposition: A | Discharge status: Home / Self Care | Payer: Medicare Other | Source: Home / Self Care | Attending: Internal Medicine | Admitting: Internal Medicine

## 2017-05-02 ENCOUNTER — Observation Stay: Payer: Medicare Other

## 2017-05-02 DIAGNOSIS — I471 Supraventricular tachycardia: Secondary | ICD-10-CM | POA: Diagnosis present

## 2017-05-02 DIAGNOSIS — R9431 Abnormal electrocardiogram [ECG] [EKG]: Secondary | ICD-10-CM | POA: Diagnosis not present

## 2017-05-02 LAB — BASIC METABOLIC PANEL
Anion gap: 9 (ref 5–15)
BUN: 25 mg/dL — AB (ref 6–20)
CO2: 23 mmol/L (ref 22–32)
Calcium: 8.6 mg/dL — ABNORMAL LOW (ref 8.9–10.3)
Chloride: 106 mmol/L (ref 101–111)
Creatinine, Ser: 1.89 mg/dL — ABNORMAL HIGH (ref 0.61–1.24)
GFR calc Af Amer: 38 mL/min — ABNORMAL LOW (ref >60)
GFR, EST NON AFRICAN AMERICAN: 33 mL/min — AB (ref >60)
GLUCOSE: 115 mg/dL — AB (ref 65–99)
POTASSIUM: 3.5 mmol/L (ref 3.5–5.1)
Sodium: 138 mmol/L (ref 135–145)

## 2017-05-02 LAB — CBC WITH DIFFERENTIAL/PLATELET
BASOS ABS: 0 10*3/uL (ref 0–0.1)
BASOS PCT: 1 %
EOS ABS: 0.3 10*3/uL (ref 0–0.7)
Eosinophils Relative: 4 %
HEMATOCRIT: 39.5 % — AB (ref 40.0–52.0)
HEMOGLOBIN: 12.8 g/dL — AB (ref 13.0–18.0)
Lymphocytes Relative: 36 %
Lymphs Abs: 3.1 10*3/uL (ref 1.0–3.6)
MCH: 27.5 pg (ref 26.0–34.0)
MCHC: 32.4 g/dL (ref 32.0–36.0)
MCV: 84.8 fL (ref 80.0–100.0)
Monocytes Absolute: 1 10*3/uL (ref 0.2–1.0)
Monocytes Relative: 11 %
NEUTROS ABS: 4.2 10*3/uL (ref 1.4–6.5)
Neutrophils Relative %: 48 %
Platelets: 218 10*3/uL (ref 150–440)
RBC: 4.66 MIL/uL (ref 4.40–5.90)
RDW: 13.8 % (ref 11.5–14.5)
WBC: 8.6 10*3/uL (ref 3.8–10.6)

## 2017-05-02 LAB — MRSA PCR SCREENING: MRSA BY PCR: NEGATIVE

## 2017-05-02 LAB — TROPONIN I: TROPONIN I: 0.04 ng/mL — AB (ref <0.03)

## 2017-05-02 LAB — MAGNESIUM: MAGNESIUM: 2.3 mg/dL (ref 1.7–2.4)

## 2017-05-02 LAB — PHOSPHORUS: PHOSPHORUS: 3.3 mg/dL (ref 2.5–4.6)

## 2017-05-02 LAB — TSH: TSH: 1.007 u[IU]/mL (ref 0.350–4.500)

## 2017-05-02 MED ORDER — HYDRALAZINE HCL 20 MG/ML IJ SOLN
10.0000 mg | INTRAMUSCULAR | Status: DC | PRN
  Administered 2017-05-02 – 2017-05-04 (×3): 10 mg via INTRAVENOUS
  Filled 2017-05-02 (×3): qty 1

## 2017-05-02 MED ORDER — ADENOSINE 6 MG/2ML IV SOLN
INTRAVENOUS | Status: AC
  Administered 2017-05-02: 6 mg via INTRAVENOUS
  Filled 2017-05-02: qty 2

## 2017-05-02 MED ORDER — ADENOSINE 12 MG/4ML IV SOLN
INTRAVENOUS | Status: AC
  Filled 2017-05-02: qty 4

## 2017-05-02 MED ORDER — METOPROLOL TARTRATE 25 MG PO TABS
25.0000 mg | ORAL_TABLET | Freq: Two times a day (BID) | ORAL | Status: DC
  Administered 2017-05-02 – 2017-05-03 (×3): 25 mg via ORAL
  Filled 2017-05-02 (×3): qty 1

## 2017-05-02 MED ORDER — POTASSIUM CHLORIDE CRYS ER 20 MEQ PO TBCR
40.0000 meq | EXTENDED_RELEASE_TABLET | Freq: Once | ORAL | Status: AC
  Administered 2017-05-02: 40 meq via ORAL
  Filled 2017-05-02: qty 2

## 2017-05-02 MED ORDER — ADENOSINE 6 MG/2ML IV SOLN
6.0000 mg | Freq: Once | INTRAVENOUS | Status: AC
  Administered 2017-05-02: 6 mg via INTRAVENOUS

## 2017-05-02 MED ORDER — ACETAMINOPHEN 325 MG PO TABS: 650.0000 mg | ORAL_TABLET | Freq: Four times a day (QID) | ORAL | Status: DC | PRN

## 2017-05-02 MED ORDER — NICOTINE 14 MG/24HR TD PT24
14.0000 mg | MEDICATED_PATCH | Freq: Every day | TRANSDERMAL | Status: DC
  Administered 2017-05-02 – 2017-05-08 (×6): 14 mg via TRANSDERMAL
  Filled 2017-05-02 (×7): qty 1

## 2017-05-02 MED ORDER — SODIUM CHLORIDE 0.9 % IV SOLN
INTRAVENOUS | Status: DC
  Administered 2017-05-02: 11:00:00 via INTRAVENOUS

## 2017-05-02 MED ORDER — ENOXAPARIN SODIUM 40 MG/0.4ML ~~LOC~~ SOLN
40.0000 mg | SUBCUTANEOUS | Status: DC
  Administered 2017-05-02 – 2017-05-08 (×7): 40 mg via SUBCUTANEOUS
  Filled 2017-05-02 (×7): qty 0.4

## 2017-05-02 MED ORDER — ACETAMINOPHEN 650 MG RE SUPP: 650.0000 mg | Freq: Four times a day (QID) | RECTAL | Status: DC | PRN

## 2017-05-02 MED ORDER — SODIUM CHLORIDE 0.9% FLUSH
3.0000 mL | Freq: Two times a day (BID) | INTRAVENOUS | Status: DC
  Administered 2017-05-02 – 2017-05-08 (×13): 3 mL via INTRAVENOUS

## 2017-05-02 NOTE — ED Notes (Signed)
Dr. Scotty CourtStafford notified of critical trop

## 2017-05-02 NOTE — ED Notes (Signed)
Upon entering room pts HR was noticed at 170s-180s, EKG preformed, DR. Stafford notified

## 2017-05-02 NOTE — Care Management Obs Status (Signed)
MEDICARE OBSERVATION STATUS NOTIFICATION   Patient Details  Name: Seth Harrison MRN: [id number] Date of Birth: 05/02/1941   Medicare Observation Status Notification Given:  Yes MOON explained/copy left at bedside for caregiver Elliot CousinMarlette Golden [phone]. Attempted also to reach owner of group home Franchot Erichsenammy Wright [phone]- went to generic VM- no message left. Patient is oriented per record X 2 and per Marlette no legal guardian.    Collie SiadAngela Annaleia Pence, RN 05/02/2017, 3:08 PM

## 2017-05-02 NOTE — H&P (Addendum)
[facility] [place] [facility]   PATIENT NAME: Breckon Reeves    MR#:  [id number]  DATE OF BIRTH:  Jun 02, 1941  DATE OF ADMISSION:  04/29/2017  PRIMARY CARE PHYSICIAN: Alvis Lemmings  REQUESTING/REFERRING PHYSICIAN: Dr Sharman Cheek  CHIEF COMPLAINT:   Chief Complaint  Patient presents with  . Weakness    HISTORY OF PRESENT ILLNESS:  Nolawi Kanady  is a 76 y.o. male came in for weakness.  He states that he cannot move his muscles to well.  His back is aching and can sit up.  Looks like he was seen in the ER a couple days ago.  The patient lives in a group home.  In the ER they were trying to place him.  He had an episode of SVT that required adenosine push.  He is currently in normal sinus rhythm.  Hospitalist services were contacted for further evaluation.  The patient is a poor historian and unable to give much history.  History obtained from old chart.  Group home said he cant walk.   PAST MEDICAL HISTORY:   Past Medical History:  Diagnosis Date  . Dementia   . Depression   . GERD (gastroesophageal reflux disease)   . Hypertension     PAST SURGICAL HISTORY:   Past Surgical History:  Procedure Laterality Date  . FACIAL COSMETIC SURGERY      SOCIAL HISTORY:   Social History  Substance Use Topics  . Smoking status: Current Every Day Smoker    Packs/day: 1.00    Types: Cigarettes  . Smokeless tobacco: Never Used  . Alcohol use No    FAMILY HISTORY:   Family History  Problem Relation Age of Onset  . Hypertension Mother     DRUG ALLERGIES:  No Known Allergies  REVIEW OF SYSTEMS:  CONSTITUTIONAL: No fever, positive for weakness.  EYES: No blurred or double vision.  EARS, NOSE, AND THROAT: No tinnitus or ear pain. No sore throat. positive RESPIRATORY: No cough, shortness of breath, wheezing or hemoptysis.  CARDIOVASCULAR: No chest pain, orthopnea, edema.  GASTROINTESTINAL: occasional nausea, vomiting. No diarrhea or  abdominal pain. No blood in bowel movements GENITOURINARY: No dysuria, hematuria.  ENDOCRINE: No polyuria, nocturia,  HEMATOLOGY: No anemia, easy bruising or bleeding SKIN: No rash or lesion. MUSCULOSKELETAL: low back pain  NEUROLOGIC: No tingling, numbness, weakness.  PSYCHIATRY: No anxiety or depression.   MEDICATIONS AT HOME:   Prior to Admission medications   Medication Sig Start Date End Date Taking? Authorizing Provider  acetaminophen (TYLENOL) 650 MG CR tablet Take 650 mg by mouth every 8 (eight) hours as needed for pain.   Yes [provider]  aspirin 325 MG tablet Take 325 mg by mouth daily.   Yes [provider]  baclofen (LIORESAL) 10 MG tablet Take 10 mg by mouth every 12 (twelve) hours as needed for muscle spasms.   Yes [provider]  carvedilol (COREG) 3.125 MG tablet Take 3.125 mg by mouth 2 (two) times daily with a meal.   Yes [provider]  cholecalciferol (VITAMIN D) 1000 units tablet Take 1,000 Units by mouth daily.   Yes [provider]  donepezil (ARICEPT) 10 MG tablet Take 10 mg by mouth at bedtime.   Yes [provider]  isosorbide mononitrate (IMDUR) 30 MG 24 hr tablet Take 30 mg by mouth daily.   Yes [provider]  lisinopril (PRINIVIL,ZESTRIL) 20 MG tablet Take 20 mg by mouth daily.   Yes [provider]  mirtazapine (REMERON) 15 MG tablet Take 15 mg by mouth at bedtime.   Yes [provider]  Multiple Vitamins-Minerals (THEREMS-M) TABS Take 1 tablet by mouth daily.   Yes [provider]  omeprazole (PRILOSEC) 40 MG capsule Take 40 mg by mouth daily.   Yes [provider]  vitamin B-12 (CYANOCOBALAMIN) 1000 MCG tablet Take 1,000 mcg by mouth daily.   Yes [provider]      VITAL SIGNS:  Blood pressure (!) 158/77, pulse 82, temperature 98.1 F (36.7 C), temperature source Oral, resp. rate 16, height 5\' 9"  (1.753 m), weight 79.4 kg (175 lb), SpO2 92  %.  PHYSICAL EXAMINATION:  GENERAL:  76 y.o.-year-old patient lying in the bed with no acute distress.  EYES: Pupils equal, round, reactive to light and accommodation. No scleral icterus. Extraocular muscles intact.  HEENT: Head atraumatic, normocephalic. Oropharynx and nasopharynx clear.  NECK:  Supple, no jugular venous distention. No thyroid enlargement, no tenderness.  LUNGS: Normal breath sounds bilaterally, no wheezing, rales,rhonchi or crepitation. No use of accessory muscles of respiration.  Upper airway wheeze CARDIOVASCULAR: S1, S2 normal. No murmurs, rubs, or gallops.  ABDOMEN: Soft, nontender, nondistended. Bowel sounds present. No organomegaly or mass.  EXTREMITIES: 2+ edema, no cyanosis, or clubbing.  NEUROLOGIC: Cranial nerves II through XII are intact. Muscle strength 5/5 in all extremities. Sensation intact. Gait not checked.  Patient able to straight leg raise without a problem. PSYCHIATRIC: The patient is alert.  SKIN: No rash, lesion, or ulcer.   LABORATORY PANEL:   CBC  Recent Labs Lab 05/02/17 0730  WBC 8.6  HGB 12.8*  HCT 39.5*  PLT 218   ------------------------------------------------------------------------------------------------------------------  Chemistries   Recent Labs Lab 05/02/17 0730  NA 138  K 3.5  CL 106  CO2 23  GLUCOSE 115*  BUN 25*  CREATININE 1.89*  CALCIUM 8.6*  MG 2.3   ------------------------------------------------------------------------------------------------------------------  Cardiac Enzymes  Recent Labs Lab 05/02/17 0730  TROPONINI 0.04*   ------------------------------------------------------------------------------------------------------------------  RADIOLOGY:  Dg Pelvis 1-2 Views  Result Date: 04/30/2017 CLINICAL DATA:  Difficulty walking. EXAM: PELVIS - 1-2 VIEW COMPARISON:  Left femur series of today's date FINDINGS: The bones are subjectively adequately mineralized. There is no lytic or blastic  lesion. The hip joint spaces exhibit mild symmetric narrowing bilaterally. The proximal femurs are unremarkable. The patient has undergone previous lower lumbar and upper sacral fusion. Radiodensities projecting over the proximal left femoral shaft likely reflect medication or other tablets. IMPRESSION: There is no acute bony abnormality of the pelvis. There is mild symmetric narrowing of the hip joint spaces compatible with osteoarthritis. Electronically Signed   By: David  SwazilandJordan M.D.   On: 04/30/2017 12:15   Ct Head Wo Contrast  Result Date: 04/30/2017 CLINICAL DATA:  Altered mental status since yesterday. EXAM: CT HEAD WITHOUT CONTRAST TECHNIQUE: Contiguous axial images were obtained from the base of the skull through the vertex without intravenous contrast. COMPARISON:  07/19/2016 FINDINGS: Brain: No evidence of acute infarction, hemorrhage, hydrocephalus, extra-axial collection or mass lesion/mass effect. Small remote left cerebellar infarct. Patchy low-density in the bilateral deep gray nuclei and deep white matter tracts attributed to advanced chronic small vessel ischemia. Generalized atrophy without specific pattern. Patient has history of dementia. Vascular: Atherosclerotic calcification.  No hyperdense vessel. Skull: Chronic posttraumatic deformity of the nasal arch, minimally covered today. Sinuses/Orbits: No acute finding. IMPRESSION: 1. No acute finding or change from January 2018. 2. 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 Electronically Signed  By: Marnee Spring M.D.   On: 04/30/2017 12:10   Dg Chest Portable 1 View  Result Date: 05/02/2017 CLINICAL DATA:  Dyspnea. EXAM: PORTABLE CHEST 1 VIEW COMPARISON:  Radiograph of July 19, 2016. FINDINGS: The heart size and mediastinal contours are within normal limits. Both lungs are clear. No pneumothorax or pleural effusion is noted. The visualized skeletal structures are unremarkable. IMPRESSION: No acute cardiopulmonary abnormality  seen. Electronically Signed   By: Lupita Raider, M.D.   On: 05/02/2017 08:38   Dg Femur Min 2 Views Left  Result Date: 04/30/2017 CLINICAL DATA:  Difficulty walking, knee pain EXAM: LEFT FEMUR 2 VIEWS COMPARISON:  None. FINDINGS: Advanced degenerative changes in the left knee with joint space narrowing and extensive spurring. Moderate joint effusion. Metallic densities noted in the anterior soft tissues at the knee level. Mild degenerative changes in the left hip. No acute bony abnormality. Specifically, no fracture, subluxation, or dislocation. Soft tissues are intact. IMPRESSION: No acute bony abnormality. Metallic foreign bodies within the soft tissues along the anterior knee. Mild left hip degenerative changes, advanced left knee degenerative changes. Electronically Signed   By: Charlett Nose M.D.   On: 04/30/2017 12:15    EKG:   1 EKG sinus rhythm 82 bpm.  Another EKG SVT 170 bpm   IMPRESSION AND PLAN:   1.  SVT requiring adenosine.  Change Coreg over to metoprolol 25 mg twice daily.  Obtain an echocardiogram.  Creatinine to high to get a CT scan of the chest.  We will get an ultrasound of the lower extremities to rule out DVT.  Check a TSH.  Replace potassium 2.  Hypokalemia.  Replace potassium 3.  Essential hypertension.  Switch Coreg over to metoprolol.  Continue other antihypertensive medications 4.  Chronic kidney disease stage III.  Monitor with very gentle IV fluids. 5.  Unable to walk as per care giver.  Physical therapy evaluation 6.  Back pain patient able to straight leg raise without a problem.  Prior x-rays negative. 7.  History of dementia on Aricept 8.  GERD on PPI 9.  Tobacco abuse.  Smoking cessation counseling done 4 minutes by me.  Initially the patient states that he smokes and when he can get them, so I did not think he needed a nicotine patch but later we need a nicotine patch.  This was prescribed.   All the records are reviewed and case discussed with ED  provider. Management plans discussed with the patient, caregiver and they are in agreement.  CODE STATUS: DNR  TOTAL TIME TAKING CARE OF THIS PATIENT: 50 minutes.    Alford Highland M.D on 05/02/2017 at 8:51 AM  Between 7am to 6pm - Pager - [phone]  After 6pm call admission pager [phone]  [facility]  [phone]  CC: Primary care physician; Alvis Lemmings

## 2017-05-02 NOTE — ED Provider Notes (Signed)
 -----------------------------------------   8:33 AM on 05/02/2017 ----------------------------------------- Level 5 caveat:  Portions of the history and physical were unable to be obtained due to: dementia     EKG interpreted by me 7:22 AM 05/02/2017 SVT, rate 170, normal axis and intervals. ST depressions in the anterior leads, likely rate related demand ischemia. Diffuse T-wave inversions in inferior and lateral leads.  7:31 AM rhythm strip shows ventricular positive conversion to sinus rhythm  7:40 AM EKG shows sinus rhythm rate of 92, normal axis and intervals. Normal QRS. Normal ST segments, slightly to inversions in inferior and lateral leadsI assumed care of the patient this morning at 7:00 AM.     On reassessment, patient was found to have tachycardia, heart rate 170-180. EKG consistent with SVT. Patient also reports now feeling short of breath. Blood pressure is decreased from what it has been trending recently. Patient was given 6 mg adenosine with conversion to normal sinus rhythm.   CRITICAL CARE Performed by: Scotty CourtSTAFFORD, Thaddeaus Monica   Total critical care time: 35 minutes  Critical care time was exclusive of separately billable procedures and treating other patients.  Critical care was necessary to treat or prevent imminent or life-threatening deterioration.  Critical care was time spent personally by me on the following activities: development of treatment plan with patient and/or surrogate as well as nursing, discussions with consultants, evaluation of patient's response to treatment, examination of patient, obtaining history from patient or surrogate, ordering and performing treatments and interventions, ordering and review of laboratory studies, ordering and review of radiographic studies, pulse oximetry and re-evaluation of patient's condition.   Final diagnoses:  Weakness  Dementia without behavioral disturbance, unspecified dementia type  SVT (supraventricular  tachycardia) (HCC)  Dyspnea, unspecified type      Sharman CheekStafford, Aubreana Cornacchia, MD 05/02/17 [phone]

## 2017-05-02 NOTE — Progress Notes (Signed)
          LCSW called and spoke to Group Home provider as I was unable to reach owner by phone number provided. Discussed the option of in home PT vs SNF STR private pay prices were discussed ( approx  $ amounts) [facility] worker will attempt to reach Group home owner to discuss options. They have used [facility] in home before. Awaiting call back from Tammy or Marlette and Consulted with [employer] FinancialEric LCSW.  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 She will call this worker back with [facility]. LCSW questioned group home owner why they did not go through [facility] doctor to have him placed.  Gali Spinney LCSW

## 2017-05-02 NOTE — Care Management (Signed)
Patient presented to the ED 10/30 from a group home that is not willing to take him back. [facility] and it appears CSW has been trying to find a facility that would accept patient.  Physical therapy evaluated 11/1 and recommendation was skilled nursing placement.  Patient only ambulated 3 feet and walked in a flexed position. On 05/02/2017- patient placed in observation for SVT that required adenosine iv push to convert to sinu rhythm.  [facility] who are in observation status.  Notified Archie Pattenonya and Gaylyn RongLaurie Veasey patient in observation

## 2017-05-02 NOTE — ED Notes (Signed)
Pt resting in bed, returned to NSR, denies any complaints

## 2017-05-02 NOTE — Clinical Social Work Note (Signed)
Clinical Social Work Assessment  Patient Details  Name: Seth Harrison MRN: [id number] Date of Birth: 05/29/1941  Date of referral:  05/01/17               Reason for consult:  Facility Placement                Permission sought to share information with:  Facility Medical sales representativeContact Representative Permission granted to share information::  Yes, Verbal Permission Granted  Name::     Seth Harrison,Seth Harrison Other [phone]   Agency::  SNF admissions  Relationship::     Contact Information:     Housing/Transportation Living arrangements for the past 2 months:  Group Home Source of Information:  Patient, Other (Comment Required) (Caregiver) Patient Interpreter Needed:  None Criminal Activity/Legal Involvement Pertinent to Current Situation/Hospitalization:  No - Comment as needed Significant Relationships:  Other(Comment) (Caregiver) Lives with:  Facility Resident Do you feel safe going back to the place where you live?  No Need for family participation in patient care:  Yes (Comment)  Care giving concerns:  Patient's staff feels he needs some rehab before he is able to return back to group home.   Social Worker assessment / plan:  Patient is a 76 year old male who is alert and oriented x2.  Patient lives at a group home, Babette Relicammy is the supervisor [phone], caregiver is Personal assistantMarlette Golden.  Assessment completed by reviewing chart and speaking to patient's caregiver.  Patient has been living at group home for a few years, and they help provide much of his ADLs.  Patient's caregiver reports that patient is having more difficulty with walking then usual and feels patient needs some rehab first before returning back home.  CSW also spoke with patient and gave CSW permission [place].  Employment status:  Retired Health and safety inspectornsurance information:  [place]:  Skilled Nursing Facility Information / Referral to community resources:  Skilled Nursing  Facility  Patient/Family's Response to care:  Patient's caregivers feel he needs some short term rehab before he is able to return back to group home.  Patient/Family's Understanding of and Emotional Response to Diagnosis, Current Treatment, and Prognosis:  Patient expressed he wants to discharge as soon as he can.  Emotional Assessment Appearance:  Appears stated age Attitude/Demeanor/Rapport:    Affect (typically observed):    Orientation:  Oriented to Self, Oriented to Place Alcohol / Substance use:  Not Applicable Psych involvement (Current and /or in the community):  No (Comment)  Discharge Needs  Concerns to be addressed:  Cognitive Concerns, Lack of Support Readmission within the last 30 days:  No Current discharge risk:  Lack of support system, Cognitively Impaired Barriers to Discharge:  Other [facility]([place] for bed offers.)   [place] [employer], Jacon Whetzel R, LCSWA 05/02/2017, 9:44 AM

## 2017-05-02 NOTE — ED Notes (Signed)
Informed MD Rifenbark of patient's BP

## 2017-05-02 NOTE — Progress Notes (Signed)
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 Group home is unwilling to take patient back and they want [facility] to go [facility].  Delta Air LinesClaudine  Carmack LCSW [phone]

## 2017-05-02 NOTE — ED Provider Notes (Signed)
-----------------------------------------   2:41 AM on 05/02/2017 -----------------------------------------   Blood pressure (!) 167/88, pulse 63, temperature 98.1 F (36.7 C), temperature source Oral, resp. rate 18, height 5\' 9"  (1.753 m), weight 79.4 kg (175 lb), SpO2 93 %.  The patient had no acute events since last update.  Calm and cooperative at this time.  Disposition is pending SW eval in the morning.   Merrily Brittleifenbark, Jashay Roddy, MD 05/02/17 [phone]

## 2017-05-03 LAB — CBC
HCT: 38.6 % — ABNORMAL LOW (ref 40.0–52.0)
Hemoglobin: 12.5 g/dL — ABNORMAL LOW (ref 13.0–18.0)
MCH: 27.6 pg (ref 26.0–34.0)
MCHC: 32.5 g/dL (ref 32.0–36.0)
MCV: 84.9 fL (ref 80.0–100.0)
PLATELETS: 208 10*3/uL (ref 150–440)
RBC: 4.55 MIL/uL (ref 4.40–5.90)
RDW: 13.5 % (ref 11.5–14.5)
WBC: 7.8 10*3/uL (ref 3.8–10.6)

## 2017-05-03 LAB — BASIC METABOLIC PANEL
Anion gap: 8 (ref 5–15)
BUN: 27 mg/dL — ABNORMAL HIGH (ref 6–20)
CALCIUM: 8.9 mg/dL (ref 8.9–10.3)
CO2: 24 mmol/L (ref 22–32)
CREATININE: 1.59 mg/dL — AB (ref 0.61–1.24)
Chloride: 105 mmol/L (ref 101–111)
GFR calc non Af Amer: 41 mL/min — ABNORMAL LOW (ref >60)
GFR, EST AFRICAN AMERICAN: 47 mL/min — AB (ref >60)
Glucose, Bld: 103 mg/dL — ABNORMAL HIGH (ref 65–99)
Potassium: 3.7 mmol/L (ref 3.5–5.1)
Sodium: 137 mmol/L (ref 135–145)

## 2017-05-03 LAB — ECHOCARDIOGRAM COMPLETE
Height: 69 in
WEIGHTICAEL: 2800 [oz_av]

## 2017-05-03 MED ORDER — LISINOPRIL 20 MG PO TABS
40.0000 mg | ORAL_TABLET | Freq: Every day | ORAL | Status: DC
  Administered 2017-05-03 – 2017-05-08 (×6): 40 mg via ORAL
  Filled 2017-05-03 (×7): qty 2

## 2017-05-03 MED ORDER — METOPROLOL TARTRATE 25 MG PO TABS
25.0000 mg | ORAL_TABLET | Freq: Once | ORAL | Status: AC
  Administered 2017-05-03: 25 mg via ORAL
  Filled 2017-05-03: qty 1

## 2017-05-03 MED ORDER — AMLODIPINE BESYLATE 10 MG PO TABS
10.0000 mg | ORAL_TABLET | Freq: Every day | ORAL | Status: DC
  Administered 2017-05-03 – 2017-05-07 (×5): 10 mg via ORAL
  Filled 2017-05-03 (×5): qty 1

## 2017-05-03 MED ORDER — METOPROLOL TARTRATE 50 MG PO TABS
50.0000 mg | ORAL_TABLET | Freq: Two times a day (BID) | ORAL | Status: DC
  Administered 2017-05-03 – 2017-05-08 (×10): 50 mg via ORAL
  Filled 2017-05-03 (×10): qty 1

## 2017-05-03 NOTE — Progress Notes (Signed)
MD notified. Pt has a 5 minute run of SVT, HR in the 180s. Pt is asymptomatic. Pt returns to SR in the 80s prior to MD returning page. MD will increase metoprolol to 50mg  and place a one time dose of 25mg  as 25mg  of metoprolol has been given this a.m. I will continue to assess.

## 2017-05-03 NOTE — Plan of Care (Signed)
Problem: Safety: Goal: Ability to remain free from injury will improve Outcome: Progressing Exit alarm applied and patient encouraged to call for assistance with activity.

## 2017-05-03 NOTE — Progress Notes (Signed)
[facility] - [place] at [facility]   PATIENT NAME: Seth FasterRobert Harrison    MR#:  [id number]  DATE OF BIRTH:  02/01/1941  SUBJECTIVE:  CHIEF COMPLAINT:   Chief Complaint  Patient presents with  . Weakness   Still weak  SVT on tele  REVIEW OF SYSTEMS:    Review of Systems  Constitutional: Positive for malaise/fatigue. Negative for chills and fever.  HENT: Negative for sore throat.   Eyes: Negative for blurred vision, double vision and pain.  Respiratory: Negative for cough, hemoptysis, shortness of breath and wheezing.   Cardiovascular: Positive for palpitations. Negative for chest pain, orthopnea and leg swelling.  Gastrointestinal: Negative for abdominal pain, constipation, diarrhea, heartburn, nausea and vomiting.  Genitourinary: Negative for dysuria and hematuria.  Musculoskeletal: Negative for back pain and joint pain.  Skin: Negative for rash.  Neurological: Positive for weakness. Negative for sensory change, speech change, focal weakness and headaches.  Endo/Heme/Allergies: Does not bruise/bleed easily.  Psychiatric/Behavioral: Negative for depression. The patient is not nervous/anxious.     DRUG ALLERGIES:  No Known Allergies  VITALS:  Blood pressure (!) 145/70, pulse 68, temperature 98.2 F (36.8 C), temperature source Oral, resp. rate 18, height 5\' 9"  (1.753 m), weight 79.4 kg (175 lb), SpO2 100 %.  PHYSICAL EXAMINATION:   Physical Exam  GENERAL:  76 y.o.-year-old patient lying in the bed with no acute distress.  EYES: Pupils equal, round, reactive to light and accommodation. No scleral icterus. Extraocular muscles intact.  HEENT: Head atraumatic, normocephalic. Oropharynx and nasopharynx clear.  NECK:  Supple, no jugular venous distention. No thyroid enlargement, no tenderness.  LUNGS: Normal breath sounds bilaterally, no wheezing, rales, rhonchi. No use of accessory muscles of respiration.  CARDIOVASCULAR: S1, S2 normal. No murmurs, rubs, or  gallops.  ABDOMEN: Soft, nontender, nondistended. Bowel sounds present. No organomegaly or mass.  EXTREMITIES: No cyanosis, clubbing or edema b/l.    NEUROLOGIC: Cranial nerves II through XII are intact. No focal Motor or sensory deficits b/l.   PSYCHIATRIC: The patient is alert and awake SKIN: No obvious rash, lesion, or ulcer.   LABORATORY PANEL:   CBC  Recent Labs Lab 05/03/17 0601  WBC 7.8  HGB 12.5*  HCT 38.6*  PLT 208   ------------------------------------------------------------------------------------------------------------------ Chemistries   Recent Labs Lab 05/02/17 0730 05/03/17 0601  NA 138 137  K 3.5 3.7  CL 106 105  CO2 23 24  GLUCOSE 115* 103*  BUN 25* 27*  CREATININE 1.89* 1.59*  CALCIUM 8.6* 8.9  MG 2.3  --    ------------------------------------------------------------------------------------------------------------------  Cardiac Enzymes  Recent Labs Lab 05/02/17 0730  TROPONINI 0.04*   ------------------------------------------------------------------------------------------------------------------  RADIOLOGY:  [place] Venous Img Lower Bilateral  Result Date: 05/02/2017 CLINICAL DATA:  Bilateral lower extremity swelling EXAM: BILATERAL LOWER EXTREMITY VENOUS DOPPLER ULTRASOUND TECHNIQUE: Gray-scale sonography with graded compression, as well as color Doppler and duplex ultrasound were performed to evaluate the lower extremity deep venous systems from the level of the common femoral vein and including the common femoral, femoral, profunda femoral, popliteal and calf veins including the posterior tibial, peroneal and gastrocnemius veins when visible. The superficial great saphenous vein was also interrogated. Spectral Doppler was utilized to evaluate flow at rest and with distal augmentation maneuvers in the common femoral, femoral and popliteal veins. COMPARISON:  None. FINDINGS: RIGHT LOWER EXTREMITY Common Femoral Vein: No evidence of thrombus.  Normal compressibility, respiratory phasicity and response to augmentation. Saphenofemoral Junction: No evidence of thrombus. Normal compressibility and flow on color Doppler  imaging. Profunda Femoral Vein: No evidence of thrombus. Normal compressibility and flow on color Doppler imaging. Femoral Vein: No evidence of thrombus. Normal compressibility, respiratory phasicity and response to augmentation. Popliteal Vein: No evidence of thrombus. Normal compressibility, respiratory phasicity and response to augmentation. Calf Veins: No evidence of thrombus. Normal compressibility and flow on color Doppler imaging. Superficial Great Saphenous Vein: No evidence of thrombus. Normal compressibility. Venous Reflux:  None. Other Findings:  None. LEFT LOWER EXTREMITY Common Femoral Vein: No evidence of thrombus. Normal compressibility, respiratory phasicity and response to augmentation. Saphenofemoral Junction: No evidence of thrombus. Normal compressibility and flow on color Doppler imaging. Profunda Femoral Vein: No evidence of thrombus. Normal compressibility and flow on color Doppler imaging. Femoral Vein: No evidence of thrombus. Normal compressibility, respiratory phasicity and response to augmentation. Popliteal Vein: No evidence of thrombus. Normal compressibility, respiratory phasicity and response to augmentation. Calf Veins: No evidence of thrombus. Normal compressibility and flow on color Doppler imaging. Superficial Great Saphenous Vein: No evidence of thrombus. Normal compressibility. Venous Reflux:  None. Other Findings:  None. IMPRESSION: No evidence of deep venous thrombosis. Electronically Signed   By: Charlett Nose M.D.   On: 05/02/2017 10:07   Dg Chest Portable 1 View  Result Date: 05/02/2017 CLINICAL DATA:  Dyspnea. EXAM: PORTABLE CHEST 1 VIEW COMPARISON:  Radiograph of July 19, 2016. FINDINGS: The heart size and mediastinal contours are within normal limits. Both lungs are clear. No pneumothorax or  pleural effusion is noted. The visualized skeletal structures are unremarkable. IMPRESSION: No acute cardiopulmonary abnormality seen. Electronically Signed   By: Lupita Raider, M.D.   On: 05/02/2017 08:38     ASSESSMENT AND PLAN:   *Supraventricular tachycardia Patient continues to have episodes of tachycardia.  Started on metoprolol 25 mg twice daily.  We will increase it to 50 mg.  Will give 25 mg additional dose stat. Continue telemetry monitoring.  *  Hypokalemia.  Replaced potassium  *  Essential hypertension.  Continue home medications.  Coreg changed to metoprolol.  *  Acute kidney injury over CKD stage III. Resolved with IV fluids  *Generalized progressive weakness.  Physical therapy to evaluate.  Skilled nursing facility at discharge.  *  History of dementia on Aricept  All the records are reviewed and case discussed with Care Management/Social Workerr. Management plans discussed with the patient, family and they are in agreement.  CODE STATUS: DNR  DVT Prophylaxis: SCDs  TOTAL TIME TAKING CARE OF THIS PATIENT: 35 minutes.   POSSIBLE D/C IN 1-2 DAYS, DEPENDING ON CLINICAL CONDITION.  Milagros Loll R M.D on 05/03/2017 at 1:39 PM  Between 7am to 6pm - Pager - [phone]  After 6pm go to [email or web address] - password [email or web address]  [facility] [place] [facility]  Office  [phone]  CC: Primary care physician; System, Pcp Not In  Note: This dictation was prepared with Dragon dictation along with smaller phrase technology. Any transcriptional errors that result from this process are unintentional.

## 2017-05-03 NOTE — Plan of Care (Signed)
Problem: Physical Regulation: Goal: Ability to maintain clinical measurements within normal limits will improve Outcome: Not Progressing Blood pressure remaining high.  Medications have been adjusted.

## 2017-05-03 NOTE — Progress Notes (Signed)
Notified Dr. Anne HahnWillis of BP of 213/110. Hydralazine ordered and given.

## 2017-05-04 MED ORDER — ISOSORBIDE MONONITRATE ER 30 MG PO TB24
60.0000 mg | ORAL_TABLET | Freq: Every day | ORAL | Status: DC
Start: 2017-05-04 — End: 2017-05-08
  Administered 2017-05-04 – 2017-05-08 (×5): 60 mg via ORAL
  Filled 2017-05-04: qty 2
  Filled 2017-05-04: qty 1
  Filled 2017-05-04: qty 2
  Filled 2017-05-04 (×2): qty 1

## 2017-05-04 NOTE — Progress Notes (Signed)
A&O. Up with assist. BP high this morning at 185/85. Hydralazine IV, PRN, given. Will continue to monitor.

## 2017-05-04 NOTE — Progress Notes (Signed)
[facility] -  at [facility]   PATIENT NAME: Seth Harrison    MR#:  [id number]  DATE OF BIRTH:  October 10, 1940  SUBJECTIVE:  CHIEF COMPLAINT:   Chief Complaint  Patient presents with  . Weakness   Complains of generalized weakness.  No SVT on telemetry monitoring.  REVIEW OF SYSTEMS:    Review of Systems  Constitutional: Positive for malaise/fatigue. Negative for chills and fever.  HENT: Negative for sore throat.   Eyes: Negative for blurred vision, double vision and pain.  Respiratory: Negative for cough, hemoptysis, shortness of breath and wheezing.   Cardiovascular: Positive for palpitations. Negative for chest pain, orthopnea and leg swelling.  Gastrointestinal: Negative for abdominal pain, constipation, diarrhea, heartburn, nausea and vomiting.  Genitourinary: Negative for dysuria and hematuria.  Musculoskeletal: Negative for back pain and joint pain.  Skin: Negative for rash.  Neurological: Positive for weakness. Negative for sensory change, speech change, focal weakness and headaches.  Endo/Heme/Allergies: Does not bruise/bleed easily.  Psychiatric/Behavioral: Negative for depression. The patient is not nervous/anxious.     DRUG ALLERGIES:  No Known Allergies  VITALS:  Blood pressure (!) 148/55, pulse 81, temperature 98.3 F (36.8 C), temperature source Oral, resp. rate 18, height 5\' 9"  (1.753 m), weight 79.5 kg (175 lb 3.2 oz), SpO2 99 %.  PHYSICAL EXAMINATION:   Physical Exam  GENERAL:  76 y.o.-year-old patient lying in the bed with no acute distress.  EYES: Pupils equal, round, reactive to light and accommodation. No scleral icterus. Extraocular muscles intact.  HEENT: Head atraumatic, normocephalic. Oropharynx and nasopharynx clear.  NECK:  Supple, no jugular venous distention. No thyroid enlargement, no tenderness.  LUNGS: Normal breath sounds bilaterally, no wheezing, rales, rhonchi. No use of accessory muscles of respiration.   CARDIOVASCULAR: S1, S2 normal. No murmurs, rubs, or gallops.  ABDOMEN: Soft, nontender, nondistended. Bowel sounds present. No organomegaly or mass.  EXTREMITIES: No cyanosis, clubbing or edema b/l.    NEUROLOGIC: Cranial nerves II through XII are intact. No focal Motor or sensory deficits b/l.   PSYCHIATRIC: The patient is alert and awake SKIN: No obvious rash, lesion, or ulcer.   LABORATORY PANEL:   CBC Recent Labs  Lab 05/03/17 0601  WBC 7.8  HGB 12.5*  HCT 38.6*  PLT 208   ------------------------------------------------------------------------------------------------------------------ Chemistries  Recent Labs  Lab 05/02/17 0730 05/03/17 0601  NA 138 137  K 3.5 3.7  CL 106 105  CO2 23 24  GLUCOSE 115* 103*  BUN 25* 27*  CREATININE 1.89* 1.59*  CALCIUM 8.6* 8.9  MG 2.3  --    ------------------------------------------------------------------------------------------------------------------  Cardiac Enzymes Recent Labs  Lab 05/02/17 0730  TROPONINI 0.04*   ------------------------------------------------------------------------------------------------------------------  RADIOLOGY:  No results found.   ASSESSMENT AND PLAN:   *Supraventricular tachycardia No episodes overnight.  On metoprolol 50 twice daily. Continue telemetry monitoring.  *Uncontrolled hypertension.  Added Norvasc.  Increase dose of M door.  Continue IV hydralazine as needed.  *  Hypokalemia.  Replaced potassium  *  Essential hypertension.  Continue home medications.  Coreg changed to metoprolol.  *  Acute kidney injury over CKD stage III. Resolved with IV fluids  *Generalized progressive weakness.  Physical therapy to evaluate.  Skilled nursing facility at discharge.  *  History of dementia on Aricept  All the records are reviewed and case discussed with Care Management/Social Workerr. Management plans discussed with the patient, family and they are in agreement.  CODE STATUS:  DNR  DVT Prophylaxis: SCDs  TOTAL TIME  TAKING CARE OF THIS PATIENT: 35 minutes.   POSSIBLE D/C IN 1-2 DAYS, DEPENDING ON CLINICAL CONDITION.  Milagros LollSudini, Kasiya Burck R M.D on 05/04/2017 at 1:35 PM  Between 7am to 6pm - Pager - [phone]  After 6pm go to [email or web address] - password [email or web address]  [facility] [place] [facility]  Office  [phone]  CC: Primary care physician; System, Pcp Not In  Note: This dictation was prepared with Dragon dictation along with smaller phrase technology. Any transcriptional errors that result from this process are unintentional.

## 2017-05-05 MED ORDER — HYDRALAZINE HCL 25 MG PO TABS
25.0000 mg | ORAL_TABLET | Freq: Three times a day (TID) | ORAL | Status: DC
  Administered 2017-05-05 – 2017-05-08 (×9): 25 mg via ORAL
  Filled 2017-05-05 (×9): qty 1

## 2017-05-05 NOTE — Progress Notes (Signed)
Physical Therapy Treatment Patient Details Name: Seth Harrison MRN: [id number] DOB: 01/30/1941 Today's Date: 05/05/2017    History of Present Illness Pt is [name] who presented via EMS from [facility] group home with generalized weakness.  No clear etiology of pt's generalized weakness.  Pt has experienced several episodes of supraventricular tachycardia while in hospital. Pt's PMH includes dementia.      PT Comments    Seth Harrison made modest progress with mobility today.  He continues to demonstrates significantly flexed trunk (almost 90 degrees at times) with any OOB activity that worsens with ambulation.  Posture improves only temporarily with constant verbal and tactile cues and demonstration.  He reports back pain with fully upright posture. This places him at a high fall risk.  He currently requires min assist for transfers and short distance ambulation.  Recommendation for SNF at d/c remains appropriate.     Follow Up Recommendations  SNF     Equipment Recommendations  None recommended by PT    Recommendations for Other Services       Precautions / Restrictions Precautions Precautions: Fall Restrictions Weight Bearing Restrictions: No    Mobility  Bed Mobility Overal bed mobility: Needs Assistance Bed Mobility: Supine to Sit     Supine to sit: Min guard;HOB elevated     General bed mobility comments: Pt requires increased time and effort.  He demonstrates difficulty elevating his trunk due to tendency for posterior lean and requires cues to advance trunk forward to achieve upright.   Transfers Overall transfer level: Needs assistance Equipment used: Rolling walker (2 wheeled) Transfers: Sit to/from Stand Sit to Stand: Min assist         General transfer comment: Min assist to boost to standing and cues for upright posture once standing as pt initially with significantly flexed posture.   Ambulation/Gait Ambulation/Gait assistance: Min  assist Ambulation Distance (Feet): 40 Feet Assistive device: Rolling walker (2 wheeled) Gait Pattern/deviations: Shuffle;Trunk flexed Gait velocity: decreased Gait velocity interpretation: Below normal speed for age/gender General Gait Details: Pt with moderately flexed trunk before attempting ambulation which improves only temporarily and pt immediately resumes flexed posture (close to 90 deg) each time he begins ambulating despite max verbal and tactile cues and demonstration.  He requires several standing rest breaks due to fatigue.    Stairs            Wheelchair Mobility    Modified Rankin (Stroke Patients Only)       Balance Overall balance assessment: Needs assistance Sitting-balance support: No upper extremity supported;Feet supported Sitting balance-Leahy Scale: Fair Sitting balance - Comments: Intermittent posterior lean sitting EOB with cues required to encourage pt to advance trunk forward Postural control: Posterior lean Standing balance support: Bilateral upper extremity supported;During functional activity Standing balance-Leahy Scale: Poor Standing balance comment: Pt relies on BUE support and physical assist for static and dynamic activities                            Cognition Arousal/Alertness: Awake/alert Behavior During Therapy: Flat affect Overall Cognitive Status: No family/caregiver present to determine baseline cognitive functioning                                 General Comments: Pt has delayed responses at times.  Requires contstant verbal cues for safety with mobility.       Exercises General  Exercises - Lower Extremity Ankle Circles/Pumps: AROM;Both;10 reps;Supine Long Arc Quad: AROM;Both;10 reps;Seated Heel Slides: AROM;Both;10 reps;Supine Straight Leg Raises: AROM;Both;5 reps;Supine    General Comments        Pertinent Vitals/Pain Pain Assessment: Faces Faces Pain Scale: Hurts little more Pain Location:  back when standing upright Pain Descriptors / Indicators: Aching;Discomfort;Guarding Pain Intervention(s): Limited activity within patient's tolerance;Monitored during session;Repositioned    Home Living                      Prior Function            PT Goals (current goals can now be found in the care plan section) Acute Rehab PT Goals Patient Stated Goal: to get stronger PT Goal Formulation: With patient Time For Goal Achievement: 05/15/17 Potential to Achieve Goals: Fair Progress towards PT goals: Progressing toward goals    Frequency    Min 2X/week      PT Plan Current plan remains appropriate    Co-evaluation              AM-PAC PT "6 Clicks" Daily Activity  Outcome Measure  Difficulty turning over in bed (including adjusting bedclothes, sheets and blankets)?: Unable Difficulty moving from lying on back to sitting on the side of the bed? : Unable Difficulty sitting down on and standing up from a chair with arms (e.g., wheelchair, bedside commode, etc,.)?: Unable Help needed moving to and from a bed to chair (including a wheelchair)?: A Little Help needed walking in hospital room?: A Lot Help needed climbing 3-5 steps with a railing? : Total 6 Click Score: 9    End of Session Equipment Utilized During Treatment: Gait belt Activity Tolerance: Patient limited by fatigue;Patient tolerated treatment well Patient left: with call bell/phone within reach;in chair;with chair alarm set Nurse Communication: Mobility status PT Visit Diagnosis: Unsteadiness on feet (R26.81);Muscle weakness (generalized) (M62.81);Other abnormalities of gait and mobility (R26.89);Difficulty in walking, not elsewhere classified (R26.2)     Time: 1610-9604 PT Time Calculation (min) (ACUTE ONLY): 28 min  Charges:  $Gait Training: 8-22 mins $Therapeutic Exercise: 8-22 mins                    G Codes:       Seth Harrison PT, DPT 05/05/2017, 4:48 PM

## 2017-05-05 NOTE — Plan of Care (Signed)
Physical Regulation: Ability to maintain clinical measurements within normal limits will improve 05/05/2017 1446 - Not Progressing by Raynald BlendImhoff, Naithan Delage M, RN Note Most recent G.F.R. = only 47. Will continue to monitor. Jari FavreSteven M Flambeau Hsptlmhoff

## 2017-05-05 NOTE — Progress Notes (Signed)
[facility] - [place] at [facility]   PATIENT NAME: Seth Harrison    MR#:  [id number]  DATE OF BIRTH:  1940/10/16  SUBJECTIVE:  CHIEF COMPLAINT:   Chief Complaint  Patient presents with  . Weakness   No further SVT Patient has no concerns  REVIEW OF SYSTEMS:    Review of Systems  Constitutional: Positive for malaise/fatigue. Negative for chills and fever.  HENT: Negative for sore throat.   Eyes: Negative for blurred vision, double vision and pain.  Respiratory: Negative for cough, hemoptysis, shortness of breath and wheezing.   Cardiovascular: Positive for palpitations. Negative for chest pain, orthopnea and leg swelling.  Gastrointestinal: Negative for abdominal pain, constipation, diarrhea, heartburn, nausea and vomiting.  Genitourinary: Negative for dysuria and hematuria.  Musculoskeletal: Negative for back pain and joint pain.  Skin: Negative for rash.  Neurological: Positive for weakness. Negative for sensory change, speech change, focal weakness and headaches.  Endo/Heme/Allergies: Does not bruise/bleed easily.  Psychiatric/Behavioral: Negative for depression. The patient is not nervous/anxious.     DRUG ALLERGIES:  No Known Allergies  VITALS:  Blood pressure (!) 131/46, pulse 67, temperature 98.2 F (36.8 C), temperature source Oral, resp. rate 19, height 5\' 9"  (1.753 m), weight 79 kg (174 lb 1.6 oz), SpO2 97 %.  PHYSICAL EXAMINATION:   Physical Exam  GENERAL:  76 y.o.-year-old patient lying in the bed with no acute distress.  EYES: Pupils equal, round, reactive to light and accommodation. No scleral icterus. Extraocular muscles intact.  HEENT: Head atraumatic, normocephalic. Oropharynx and nasopharynx clear.  NECK:  Supple, no jugular venous distention. No thyroid enlargement, no tenderness.  LUNGS: Normal breath sounds bilaterally, no wheezing, rales, rhonchi. No use of accessory muscles of respiration.  CARDIOVASCULAR: S1, S2 normal. No  murmurs, rubs, or gallops.  ABDOMEN: Soft, nontender, nondistended. Bowel sounds present. No organomegaly or mass.  EXTREMITIES: No cyanosis, clubbing or edema b/l.    NEUROLOGIC: Cranial nerves II through XII are intact. No focal Motor or sensory deficits b/l.   PSYCHIATRIC: The patient is alert and awake SKIN: No obvious rash, lesion, or ulcer.   LABORATORY PANEL:   CBC Recent Labs  Lab 05/03/17 0601  WBC 7.8  HGB 12.5*  HCT 38.6*  PLT 208   ------------------------------------------------------------------------------------------------------------------ Chemistries  Recent Labs  Lab 05/02/17 0730 05/03/17 0601  NA 138 137  K 3.5 3.7  CL 106 105  CO2 23 24  GLUCOSE 115* 103*  BUN 25* 27*  CREATININE 1.89* 1.59*  CALCIUM 8.6* 8.9  MG 2.3  --    ------------------------------------------------------------------------------------------------------------------  Cardiac Enzymes Recent Labs  Lab 05/02/17 0730  TROPONINI 0.04*   ------------------------------------------------------------------------------------------------------------------  RADIOLOGY:  No results found.   ASSESSMENT AND PLAN:   *Supraventricular tachycardia No episodes overnight.  On metoprolol 50 twice daily. Continue telemetry monitoring. No further episodes  *Uncontrolled hypertension. On Norvasc, Imdur, metoprolol. Add hydralazine  *  Hypokalemia.  Replaced potassium  *  Essential hypertension.  Continue home medications.  Coreg changed to metoprolol.  *  Acute kidney injury over CKD stage III. Resolved with IV fluids. stopped  *Generalized progressive weakness.  Physical therapy to evaluate.  Skilled nursing facility at discharge.  *  History of dementia on Aricept  CODE STATUS: DNR  DVT Prophylaxis: SCDs  TOTAL TIME TAKING CARE OF THIS PATIENT: 35 minutes.   POSSIBLE D/C IN 1-2 DAYS, DEPENDING ON CLINICAL CONDITION.  Milagros Loll R M.D on 05/05/2017 at 11:39  AM  Between 7am to 6pm -  Pager - [phone]  After 6pm go to [email or web address] - password [email or web address]  [facility] [place] [facility]  Office  [phone]  CC: Primary care physician; System, Pcp Not In  Note: This dictation was prepared with Dragon dictation along with smaller phrase technology. Any transcriptional errors that result from this process are unintentional.

## 2017-05-05 NOTE — Plan of Care (Signed)
  Tissue Perfusion: Risk factors for ineffective tissue perfusion will decrease 05/05/2017 0002 - Progressing by Heywood IlesSoriano, Valyn Latchford J, RN   Activity: Risk for activity intolerance will decrease 05/05/2017 0002 - Progressing by Heywood IlesSoriano, Blaise Grieshaber J, RN   Fluid Volume: Ability to maintain a balanced intake and output will improve 05/05/2017 0002 - Progressing by Heywood IlesSoriano, Hulen Mandler J, RN   Nutrition: Adequate nutrition will be maintained 05/05/2017 0002 - Progressing by Heywood IlesSoriano, Jahkai Yandell J, RN   Bowel/Gastric: Will not experience complications related to bowel motility 05/05/2017 0002 - Progressing by Heywood IlesSoriano, Kissie Ziolkowski J, RN

## 2017-05-05 NOTE — Care Management (Signed)
Discussed during progression of the need to have daily physical therapy for patient while he is here  and have staff assist in ambulation.  This may allow placement at another ALF which may be easier to find.  His cardiac status is stable and per attending, medically stable for discahrge

## 2017-05-06 NOTE — Progress Notes (Signed)
Physical Therapy Treatment Patient Details Name: Seth FasterRobert Harrison MRN: [id number] DOB: 10/06/1940 Today's Date: 05/06/2017    History of Present Illness Pt is [name] who presented via EMS from [facility] home with generalized weakness.  No clear etiology of pt's generalized weakness.  Pt has experienced several episodes of supraventricular tachycardia while in hospital. Pt's PMH includes dementia.      PT Comments    Pt with better stability, distance and confidence with ambulation today.  He still has slow and stooped gait and needed considerable and consistent cuing but ultimately went further and was much safer today.  He showed better strength/quality of movement with R LE than L with seated exercises but again seemed to do better than previous PT sessions.  Pt making gains, still would benefit from STR on discharge.    Follow Up Recommendations  SNF     Equipment Recommendations  None recommended by PT    Recommendations for Other Services       Precautions / Restrictions Precautions Precautions: Fall Restrictions Weight Bearing Restrictions: No    Mobility  Bed Mobility Overal bed mobility: Modified Independent Bed Mobility: Supine to Sit     Supine to sit: Min guard     General bed mobility comments: Pt did well getting to sitting, needed some minimal extra effort, but no direct assist from PT  Transfers Overall transfer level: Needs assistance Equipment used: Rolling walker (2 wheeled) Transfers: Sit to/from Stand Sit to Stand: Min assist;Min guard         General transfer comment: Pt needing cuing to insure he used UEs on rails/bed and did not try to pull up from the walker.  Pt did need light assist to shift weight forward and lift bottom to upright  Ambulation/Gait Ambulation/Gait assistance: Min assist Ambulation Distance (Feet): 85 Feet Assistive device: Rolling walker (2 wheeled)   Gait velocity: decreased Gait velocity interpretation:  <1.8 ft/sec, indicative of risk for recurrent falls General Gait Details: Pt continues to display forward flexed posture and needed regular cuing to try to stay relatively upright.  He showed good effort with "prolonged" ambulation and though he had some fatigue was not overly tired with the effort.    Stairs            Wheelchair Mobility    Modified Rankin (Stroke Patients Only)       Balance     Sitting balance-Leahy Scale: Fair       Standing balance-Leahy Scale: Fair                              Cognition Arousal/Alertness: Awake/alert Behavior During Therapy: WFL for tasks assessed/performed Overall Cognitive Status: No family/caregiver present to determine baseline cognitive functioning                                        Exercises General Exercises - Lower Extremity Ankle Circles/Pumps: AROM;Both;10 reps;Supine Long Arc Quad: AROM;Both;10 reps;Seated Heel Slides: AROM;Both;10 reps;Supine Hip Flexion/Marching: AROM;Both;10 reps;Seated    General Comments        Pertinent Vitals/Pain Pain Location: continues to c/o mild chronic back pain    Home Living                      Prior Function  PT Goals (current goals can now be found in the care plan section) Progress towards PT goals: Progressing toward goals    Frequency    Min 2X/week      PT Plan Current plan remains appropriate    Co-evaluation              AM-PAC PT "6 Clicks" Daily Activity  Outcome Measure  Difficulty turning over in bed (including adjusting bedclothes, sheets and blankets)?: A Little Difficulty moving from lying on back to sitting on the side of the bed? : A Little Difficulty sitting down on and standing up from a chair with arms (e.g., wheelchair, bedside commode, etc,.)?: Unable Help needed moving to and from a bed to chair (including a wheelchair)?: A Little Help needed walking in hospital room?: A  Little Help needed climbing 3-5 steps with a railing? : A Lot 6 Click Score: 15    End of Session Equipment Utilized During Treatment: Gait belt Activity Tolerance: Patient limited by fatigue;Patient tolerated treatment well Patient left: with bed alarm set;with call bell/phone within reach;with nursing/sitter in room Nurse Communication: Mobility status PT Visit Diagnosis: Unsteadiness on feet (R26.81);Muscle weakness (generalized) (M62.81);Other abnormalities of gait and mobility (R26.89);Difficulty in walking, not elsewhere classified (R26.2)     Time: [id number] PT Time Calculation (min) (ACUTE ONLY): 24 min  Charges:  $Gait Training: 8-22 mins $Therapeutic Exercise: 8-22 mins                    G Codes:       Seth ProGalen R Dshaun Harrison, DPT 05/06/2017, 5:42 PM

## 2017-05-06 NOTE — Progress Notes (Signed)
Patient refusing to leave telemetry on, has had stable HR for >24 hours, will d/c cardiac monitor.  Kristeen MissWILLIS, [place] FIELDING [facility] 05/06/2017, 1:26 AM

## 2017-05-06 NOTE — NC FL2 (Signed)
[place] MEDICAID FL2 LEVEL OF CARE SCREENING TOOL     IDENTIFICATION  Patient Name: Seth FasterRobert Farruggia Birthdate: 10/13/1940 Sex: male Admission Date (Current Location): 04/29/2017  [place]   Facility and Address:  [facility], [address], [place], [place] [address]      Provider Number: [id number]  Attending Physician Name and Address:  Milagros LollSudini, Srikar, MD  Relative Name and Phone Number:  Danae OrleansGolden,Marlette Other [phone]     Current Level of Care: Hospital Recommended Level of Care: Skilled Nursing Facility Prior Approval Number:    Date Approved/Denied:   PASRR Number: [id number]  Discharge Plan: SNF    Current Diagnoses: Patient Active Problem List   Diagnosis Date Noted  . SVT (supraventricular tachycardia) (HCC) 05/02/2017    Orientation RESPIRATION BLADDER Height & Weight     Self, Place, Situation  Normal Continent Weight: 207 lb 3.2 oz (94 kg) Height:  5\' 9"  (175.3 cm)  BEHAVIORAL SYMPTOMS/MOOD NEUROLOGICAL BOWEL NUTRITION STATUS      Continent Diet(Cardiac diet)  AMBULATORY STATUS COMMUNICATION OF NEEDS Skin   Limited Assist Verbally Normal                       Personal Care Assistance Level of Assistance  Bathing, Feeding, Dressing Bathing Assistance: Limited assistance Feeding assistance: Independent Dressing Assistance: Limited assistance     Functional Limitations Info  Sight, Speech, Hearing Sight Info: Adequate Hearing Info: Adequate Speech Info: Adequate    SPECIAL CARE FACTORS FREQUENCY  PT (By licensed PT), OT (By licensed OT)     PT Frequency: 5x a week OT Frequency: 5x a week            Contractures Contractures Info: Not present    Additional Factors Info  Code Status, Allergies Code Status Info: DNR Allergies Info: NKA           Current Medications (05/06/2017):  This is the current hospital active medication list Current Facility-Administered  Medications  Medication Dose Route Frequency Provider Last Rate Last Dose  . acetaminophen (TYLENOL) tablet 650 mg  650 mg Oral Q6H PRN Alford HighlandWieting, Richard, MD       Or  . acetaminophen (TYLENOL) suppository 650 mg  650 mg Rectal Q6H PRN Wieting, Richard, MD      . amLODipine (NORVASC) tablet 10 mg  10 mg Oral Daily Milagros LollSudini, Srikar, MD   10 mg at 05/06/17 0944  . aspirin EC tablet 325 mg  325 mg Oral Daily Willy Eddyobinson, Patrick, MD   325 mg at 05/06/17 0944  . baclofen (LIORESAL) tablet 10 mg  10 mg Oral Q12H PRN Willy Eddyobinson, Patrick, MD      . cholecalciferol (VITAMIN D) tablet 1,000 Units  1,000 Units Oral Daily Willy Eddyobinson, Patrick, MD   1,000 Units at 05/06/17 0944  . donepezil (ARICEPT) tablet 10 mg  10 mg Oral QHS Willy Eddyobinson, Patrick, MD   10 mg at 05/06/17 0214  . enoxaparin (LOVENOX) injection 40 mg  40 mg Subcutaneous Q24H Alford HighlandWieting, Richard, MD   40 mg at 05/06/17 0944  . hydrALAZINE (APRESOLINE) injection 10 mg  10 mg Intravenous Q4H PRN Oralia ManisWillis, David, MD   10 mg at 05/04/17 [id number]  . hydrALAZINE (APRESOLINE) tablet 25 mg  25 mg Oral Q8H Sudini, Wardell HeathSrikar, MD   25 mg at 05/06/17 0622  . isosorbide mononitrate (IMDUR) 24 hr tablet 60 mg  60 mg Oral Daily Milagros LollSudini, Srikar, MD   60 mg at 05/06/17 0944  .  lisinopril (PRINIVIL,ZESTRIL) tablet 40 mg  40 mg Oral Daily Milagros LollSudini, Srikar, MD   40 mg at 05/06/17 0944  . metoprolol tartrate (LOPRESSOR) tablet 50 mg  50 mg Oral BID Milagros LollSudini, Srikar, MD   50 mg at 05/06/17 0944  . mirtazapine (REMERON) tablet 15 mg  15 mg Oral QHS Willy Eddyobinson, Patrick, MD   15 mg at 05/05/17 2227  . multivitamin with minerals tablet 1 tablet  1 tablet Oral Daily Willy Eddyobinson, Patrick, MD   1 tablet at 05/06/17 0944  . nicotine (NICODERM CQ - dosed in mg/24 hours) patch 14 mg  14 mg Transdermal Daily Alford HighlandWieting, Richard, MD   14 mg at 05/05/17 1008  . pantoprazole (PROTONIX) EC tablet 40 mg  40 mg Oral Daily Willy Eddyobinson, Patrick, MD   40 mg at 05/06/17 0944  . sodium chloride flush (NS) 0.9 % injection 3 mL   3 mL Intravenous Q12H Alford HighlandWieting, Richard, MD   3 mL at 05/06/17 0946  . vitamin B-12 (CYANOCOBALAMIN) tablet 1,000 mcg  1,000 mcg Oral Daily Willy Eddyobinson, Patrick, MD   1,000 mcg at 05/06/17 [id number]     Discharge Medications: Please see discharge summary for a list of discharge medications.  Relevant Imaging Results:  Relevant Lab Results:   Additional Information SSN [id number]  Darleene Cleavernterhaus, Destynee Stringfellow R, [place]

## 2017-05-06 NOTE — Progress Notes (Signed)
[facility] -  at [facility]   PATIENT NAME: Seth FasterRobert Harrison    MR#:  [id number]  DATE OF BIRTH:  01/09/1941  SUBJECTIVE:  CHIEF COMPLAINT:   Chief Complaint  Patient presents with  . Weakness   No further SVT  Patient has no concerns today  REVIEW OF SYSTEMS:    Review of Systems  Constitutional: Positive for malaise/fatigue. Negative for chills and fever.  HENT: Negative for sore throat.   Eyes: Negative for blurred vision, double vision and pain.  Respiratory: Negative for cough, hemoptysis, shortness of breath and wheezing.   Cardiovascular: Positive for palpitations. Negative for chest pain, orthopnea and leg swelling.  Gastrointestinal: Negative for abdominal pain, constipation, diarrhea, heartburn, nausea and vomiting.  Genitourinary: Negative for dysuria and hematuria.  Musculoskeletal: Negative for back pain and joint pain.  Skin: Negative for rash.  Neurological: Positive for weakness. Negative for sensory change, speech change, focal weakness and headaches.  Endo/Heme/Allergies: Does not bruise/bleed easily.  Psychiatric/Behavioral: Negative for depression. The patient is not nervous/anxious.     DRUG ALLERGIES:  No Known Allergies  VITALS:  Blood pressure 127/79, pulse (!) 55, temperature 98 F (36.7 C), temperature source Oral, resp. rate 16, height 5\' 9"  (1.753 m), weight 94 kg (207 lb 3.2 oz), SpO2 98 %.  PHYSICAL EXAMINATION:   Physical Exam  GENERAL:  76 y.o.-year-old patient lying in the bed with no acute distress.  EYES: Pupils equal, round, reactive to light and accommodation. No scleral icterus. Extraocular muscles intact.  HEENT: Head atraumatic, normocephalic. Oropharynx and nasopharynx clear.  NECK:  Supple, no jugular venous distention. No thyroid enlargement, no tenderness.  LUNGS: Normal breath sounds bilaterally, no wheezing, rales, rhonchi. No use of accessory muscles of respiration.  CARDIOVASCULAR: S1, S2 normal. No  murmurs, rubs, or gallops.  ABDOMEN: Soft, nontender, nondistended. Bowel sounds present. No organomegaly or mass.  EXTREMITIES: No cyanosis, clubbing or edema b/l.    NEUROLOGIC: Cranial nerves II through XII are intact. No focal Motor or sensory deficits b/l.   PSYCHIATRIC: The patient is alert and awake SKIN: No obvious rash, lesion, or ulcer.   LABORATORY PANEL:   CBC Recent Labs  Lab 05/03/17 0601  WBC 7.8  HGB 12.5*  HCT 38.6*  PLT 208   ------------------------------------------------------------------------------------------------------------------ Chemistries  Recent Labs  Lab 05/02/17 0730 05/03/17 0601  NA 138 137  K 3.5 3.7  CL 106 105  CO2 23 24  GLUCOSE 115* 103*  BUN 25* 27*  CREATININE 1.89* 1.59*  CALCIUM 8.6* 8.9  MG 2.3  --    ------------------------------------------------------------------------------------------------------------------  Cardiac Enzymes Recent Labs  Lab 05/02/17 0730  TROPONINI 0.04*   ------------------------------------------------------------------------------------------------------------------  RADIOLOGY:  No results found.   ASSESSMENT AND PLAN:   *Supraventricular tachycardia No episodes overnight.  On metoprolol 50 twice daily. Continue telemetry monitoring. No further episodes.  *Uncontrolled hypertension. On Norvasc, Imdur, metoprolol. hydralazine  *  Hypokalemia.  Replaced potassium.  *  Essential hypertension.  Continue home medications.  Coreg changed to metoprolol.  *  Acute kidney injury over CKD stage III. Resolved with IV fluids. stopped  *Generalized progressive weakness.  Physical therapy to evaluate.  Skilled nursing facility at discharge.  *  History of dementia on Aricept  CODE STATUS: DNR  DVT Prophylaxis: SCDs  TOTAL TIME TAKING CARE OF THIS PATIENT: 35 minutes.   Waiting for SNF  Milagros LollSudini, Patricia Fargo R M.D on 05/06/2017 at 12:50 PM  Between 7am to 6pm - Pager -  [phone]  After 6pm go to [email or web address] - password [email or web address]  [facility] [place] [facility]  Office  [phone]  CC: Primary care physician; System, Pcp Not In  Note: This dictation was prepared with Dragon dictation along with smaller phrase technology. Any transcriptional errors that result from this process are unintentional.

## 2017-05-06 NOTE — Clinical Social Work Note (Signed)
CSW spoke to family care home owner Tammy at [phone] in regards to patient's finances.  Tammy reports that [facility] [place] [facility], and around $578 from social security.  Group home owner states that patient needs too much assistance and they can not provide the help that he needs.  CSW was informed by group home owner that patient pays his monthly cost to her via check and patient is his own payee.  [facility] at the [facility] and is Dr. Judie GrieveBryan.  CSW [facility] to [place] [facility] who said patient has an appointment scheduled for 05/08/17.  Renae Fickleaul informed CSW [facility].  CSW also refaxed patient's information to SNFs to see if they can review and accept patient.  Ervin KnackEric R. Imagene Boss, MSW, Theresia MajorsLCSWA [phone]  05/06/2017 10:21 AM

## 2017-05-07 ENCOUNTER — Other Ambulatory Visit: Payer: Self-pay

## 2017-05-07 NOTE — Plan of Care (Signed)
Continue to monitor patient and reorient

## 2017-05-07 NOTE — Clinical Social Work Note (Signed)
CS[facility] who checked with [facility] in regards to patient's benefits.  He does not have any VA SNF benefits.  CSW continuing to follow patient's progress throughout discharge planning.  Patient is improving with PT, CSW will contact group home tomorrow and see if they can assess patient to see if he has improved enough.  Ervin KnackEric R. Brennon Otterness, MSW, Theresia MajorsLCSWA [phone]  05/07/2017 6:18 PM

## 2017-05-07 NOTE — Progress Notes (Signed)
[facility] - [place] [facility]   PATIENT NAME: Seth FasterRobert Harrison    MR#:  [id number]  DATE OF BIRTH:  05/26/1941  SUBJECTIVE:  CHIEF COMPLAINT:   Chief Complaint  Patient presents with  . Weakness   Doing well.  Pleasantly confused  REVIEW OF SYSTEMS:    Review of Systems  Constitutional: Positive for malaise/fatigue. Negative for chills and fever.  HENT: Negative for sore throat.   Eyes: Negative for blurred vision, double vision and pain.  Respiratory: Negative for cough, hemoptysis, shortness of breath and wheezing.   Cardiovascular: Positive for palpitations. Negative for chest pain, orthopnea and leg swelling.  Gastrointestinal: Negative for abdominal pain, constipation, diarrhea, heartburn, nausea and vomiting.  Genitourinary: Negative for dysuria and hematuria.  Musculoskeletal: Negative for back pain and joint pain.  Skin: Negative for rash.  Neurological: Positive for weakness. Negative for sensory change, speech change, focal weakness and headaches.  Endo/Heme/Allergies: Does not bruise/bleed easily.  Psychiatric/Behavioral: Negative for depression. The patient is not nervous/anxious.     DRUG ALLERGIES:  No Known Allergies  VITALS:  Blood pressure (!) 146/70, pulse (!) 57, temperature 97.7 F (36.5 C), temperature source Oral, resp. rate 16, height 5\' 9"  (1.753 m), weight 95.4 kg (210 lb 4.8 oz), SpO2 100 %.  PHYSICAL EXAMINATION:   Physical Exam  GENERAL:  76 y.o.-year-old patient lying in the bed with no acute distress.  EYES: Pupils equal, round, reactive to light and accommodation. No scleral icterus. Extraocular muscles intact.  HEENT: Head atraumatic, normocephalic. Oropharynx and nasopharynx clear.  NECK:  Supple, no jugular venous distention. No thyroid enlargement, no tenderness.  LUNGS: Normal breath sounds bilaterally, no wheezing, rales, rhonchi. No use of accessory muscles of respiration.  CARDIOVASCULAR: S1, S2 normal. No  murmurs, rubs, or gallops.  ABDOMEN: Soft, nontender, nondistended. Bowel sounds present. No organomegaly or mass.  EXTREMITIES: No cyanosis, clubbing or edema b/l.    NEUROLOGIC: Cranial nerves II through XII are intact. No focal Motor or sensory deficits b/l.   PSYCHIATRIC: The patient is alert and awake SKIN: No obvious rash, lesion, or ulcer.   LABORATORY PANEL:   CBC Recent Labs  Lab 05/03/17 0601  WBC 7.8  HGB 12.5*  HCT 38.6*  PLT 208   ------------------------------------------------------------------------------------------------------------------ Chemistries  Recent Labs  Lab 05/02/17 0730 05/03/17 0601  NA 138 137  K 3.5 3.7  CL 106 105  CO2 23 24  GLUCOSE 115* 103*  BUN 25* 27*  CREATININE 1.89* 1.59*  CALCIUM 8.6* 8.9  MG 2.3  --    ------------------------------------------------------------------------------------------------------------------  Cardiac Enzymes Recent Labs  Lab 05/02/17 0730  TROPONINI 0.04*   ------------------------------------------------------------------------------------------------------------------  RADIOLOGY:  No results found.   ASSESSMENT AND PLAN:   *Supraventricular tachycardia No episodes overnight.  On metoprolol 50 twice daily. Continue telemetry monitoring. No further episodes.  *Uncontrolled hypertension. On Norvasc, Imdur, metoprolol. hydralazine  *  Hypokalemia.  Replaced potassium.  *  Essential hypertension.  Continue home medications.  Coreg changed to metoprolol.  *  Acute kidney injury over CKD stage III. Resolved with IV fluids. stopped  *Generalized progressive weakness.  Physical therapy to evaluate.  Skilled nursing facility at discharge.  *  History of dementia on Aricept  CODE STATUS: DNR  DVT Prophylaxis: SCDs  TOTAL TIME TAKING CARE OF THIS PATIENT: 35 minutes.   Waiting for SNF  Milagros LollSudini, Lataysha Vohra R M.D on 05/07/2017 at 12:01 PM  Between 7am to 6pm - Pager -  [phone]  After 6pm go  to [email or web address] - password [email or web address]  [facility] [place] [facility]  Office  [phone]  CC: Primary care physician; System, Pcp Not In  Note: This dictation was prepared with Dragon dictation along with smaller phrase technology. Any transcriptional errors that result from this process are unintentional.

## 2017-05-08 ENCOUNTER — Other Ambulatory Visit: Payer: Self-pay

## 2017-05-08 MED ORDER — ISOSORBIDE MONONITRATE ER 60 MG PO TB24: 60.0000 mg | ORAL_TABLET | Freq: Every day | ORAL | 0 refills | Status: AC

## 2017-05-08 MED ORDER — AMLODIPINE BESYLATE 10 MG PO TABS: 10.0000 mg | ORAL_TABLET | Freq: Every day | ORAL | 0 refills | Status: AC

## 2017-05-08 MED ORDER — LISINOPRIL 40 MG PO TABS: 40.0000 mg | ORAL_TABLET | Freq: Every day | ORAL | 0 refills | Status: DC

## 2017-05-08 MED ORDER — METOPROLOL TARTRATE 25 MG PO TABS: 25.0000 mg | ORAL_TABLET | Freq: Two times a day (BID) | ORAL | 0 refills | Status: DC

## 2017-05-08 NOTE — [place] FL2 (Signed)
[place] MEDICAID FL2 LEVEL OF CARE SCREENING TOOL     IDENTIFICATION  Patient Name: Seth Harrison Birthdate: 02/27/1941 Sex: male Admission Date (Current Location): 04/29/2017  [place]   Facility and Address:  [facility], [address], [place], [place] [address]      Provider Number: [id number]  Attending Physician Name and Address:  Milagros LollSudini, Srikar, MD  Relative Name and Phone Number:  Danae OrleansGolden,Marlette Other [phone]     Current Level of Care: Hospital [facility] Prior Approval Number:    Date Approved/Denied:   PASRR Number: [id number]  Discharge Plan: Domiciliary (Rest home)([facility].)    Current Diagnoses: Patient Active Problem List   Diagnosis Date Noted  . SVT (supraventricular tachycardia) (HCC) 05/02/2017    Orientation RESPIRATION BLADDER Height & Weight     Self, Place, Situation  Normal Incontinent Weight: 209 lb 1.6 oz (94.8 kg) Height:  5\' 8"  (172.7 cm)  BEHAVIORAL SYMPTOMS/MOOD NEUROLOGICAL BOWEL NUTRITION STATUS      Continent Diet(Cardiac diet)  AMBULATORY STATUS COMMUNICATION OF NEEDS Skin   Supervision Verbally Normal                       Personal Care Assistance Level of Assistance  Bathing, Feeding, Dressing Bathing Assistance: Limited assistance Feeding assistance: Independent Dressing Assistance: Limited assistance     Functional Limitations Info  Sight, Speech, Hearing Sight Info: Adequate Hearing Info: Adequate Speech Info: Adequate    SPECIAL CARE FACTORS FREQUENCY  PT (By licensed PT), OT (By licensed OT)   Home Health     PT Frequency: Minimum 2x a week OT Frequency: Minimum 2x a week            Contractures Contractures Info: Not present    Additional Factors Info  Code Status, Allergies Code Status Info: DNR Allergies Info: NKA           Current Medications (05/08/2017):  This is the  current hospital active medication list Current Facility-Administered Medications  Medication Dose Route Frequency Provider Last Rate Last Dose  . acetaminophen (TYLENOL) tablet 650 mg  650 mg Oral Q6H PRN Alford HighlandWieting, Richard, MD       Or  . acetaminophen (TYLENOL) suppository 650 mg  650 mg Rectal Q6H PRN Wieting, Richard, MD      . amLODipine (NORVASC) tablet 10 mg  10 mg Oral Daily Milagros LollSudini, Srikar, MD   10 mg at 05/07/17 0944  . aspirin EC tablet 325 mg  325 mg Oral Daily Willy Eddyobinson, Patrick, MD   325 mg at 05/08/17 1021  . baclofen (LIORESAL) tablet 10 mg  10 mg Oral Q12H PRN Willy Eddyobinson, Patrick, MD      . cholecalciferol (VITAMIN D) tablet 1,000 Units  1,000 Units Oral Daily Willy Eddyobinson, Patrick, MD   1,000 Units at 05/08/17 1021  . donepezil (ARICEPT) tablet 10 mg  10 mg Oral QHS Willy Eddyobinson, Patrick, MD   10 mg at 05/07/17 2237  . enoxaparin (LOVENOX) injection 40 mg  40 mg Subcutaneous Q24H Wieting, Richard, MD   40 mg at 05/08/17 1021  . hydrALAZINE (APRESOLINE) injection 10 mg  10 mg Intravenous Q4H PRN Oralia ManisWillis, David, MD   10 mg at 05/04/17 [id number]  . isosorbide mononitrate (IMDUR) 24 hr tablet 60 mg  60 mg Oral Daily Milagros LollSudini, Srikar, MD   60 mg at 05/08/17 1019  . lisinopril (PRINIVIL,ZESTRIL) tablet 40 mg  40 mg Oral  Daily Milagros LollSudini, Srikar, MD   40 mg at 05/08/17 1019  . metoprolol tartrate (LOPRESSOR) tablet 50 mg  50 mg Oral BID Milagros LollSudini, Srikar, MD   50 mg at 05/08/17 1020  . mirtazapine (REMERON) tablet 15 mg  15 mg Oral QHS Willy Eddyobinson, Patrick, MD   15 mg at 05/07/17 2237  . multivitamin with minerals tablet 1 tablet  1 tablet Oral Daily Willy Eddyobinson, Patrick, MD   1 tablet at 05/08/17 1018  . nicotine (NICODERM CQ - dosed in mg/24 hours) patch 14 mg  14 mg Transdermal Daily Alford HighlandWieting, Richard, MD   14 mg at 05/08/17 1021  . pantoprazole (PROTONIX) EC tablet 40 mg  40 mg Oral Daily Willy Eddyobinson, Patrick, MD   40 mg at 05/08/17 1021  . sodium chloride flush (NS) 0.9 % injection 3 mL  3 mL Intravenous Q12H Alford HighlandWieting,  Richard, MD   3 mL at 05/08/17 1022  . vitamin B-12 (CYANOCOBALAMIN) tablet 1,000 mcg  1,000 mcg Oral Daily Willy Eddyobinson, Patrick, MD   1,000 mcg at 05/08/17 1019     Discharge Medications: Please see discharge summary for a list of discharge medications.  Current Discharge Medication List        START taking these medications   Details  amLODipine (NORVASC) 10 MG tablet Take 1 tablet (10 mg total) daily by mouth. Qty: 30 tablet, Refills: 0    metoprolol tartrate (LOPRESSOR) 25 MG tablet Take 1 tablet (25 mg total) 2 (two) times daily by mouth. Qty: 60 tablet, Refills: 0          CONTINUE these medications which have CHANGED   Details  isosorbide mononitrate (IMDUR) 60 MG 24 hr tablet Take 1 tablet (60 mg total) daily by mouth. Qty: 30 tablet, Refills: 0    lisinopril (PRINIVIL,ZESTRIL) 40 MG tablet Take 1 tablet (40 mg total) daily by mouth. Qty: 30 tablet, Refills: 0          CONTINUE these medications which have NOT CHANGED   Details  acetaminophen (TYLENOL) 650 MG CR tablet Take 650 mg by mouth every 8 (eight) hours as needed for pain.    aspirin 325 MG tablet Take 325 mg by mouth daily.    baclofen (LIORESAL) 10 MG tablet Take 10 mg by mouth every 12 (twelve) hours as needed for muscle spasms.    cholecalciferol (VITAMIN D) 1000 units tablet Take 1,000 Units by mouth daily.    donepezil (ARICEPT) 10 MG tablet Take 10 mg by mouth at bedtime.    mirtazapine (REMERON) 15 MG tablet Take 15 mg by mouth at bedtime.    Multiple Vitamins-Minerals (THEREMS-M) TABS Take 1 tablet by mouth daily.    omeprazole (PRILOSEC) 40 MG capsule Take 40 mg by mouth daily.    vitamin B-12 (CYANOCOBALAMIN) 1000 MCG tablet Take 1,000 mcg by mouth daily.         STOP taking these medications     carvedilol (COREG) 3.125 MG tablet             Relevant Imaging Results:  Relevant Lab Results:   Additional Information SSN [id number]  Darleene Cleavernterhaus, Kaicen Desena  R, [place]

## 2017-05-08 NOTE — Care Management (Addendum)
Patient for discharge today.  Group Home will accept patient back with home health services.  Facility has agency preference for [facility] and this is also agency that provides [facility] .  [facility] accepted the referral for SN PT OT aide and social work.  Discussed possible need for assisted living level of care.  Follow[facility]lie [facility]- fax number [phone].  [phone] or [phone]

## 2017-05-08 NOTE — Discharge Instructions (Addendum)
Heart healthy diet ° °Activity as tolerated with assistance °

## 2017-05-08 NOTE — Progress Notes (Signed)
Physical Therapy Treatment Patient Details Name: Seth FasterRobert Harrison MRN: [id number] DOB: 07/18/1940 Today's Date: 05/08/2017    History of Present Illness Pt is [name] who presented via EMS from [facility] group home with generalized weakness.  No clear etiology of pt's generalized weakness.  Pt has experienced several episodes of supraventricular tachycardia while in hospital. Pt's PMH includes dementia.      PT Comments    Pt bed, awoke easily for session.  To edge of bed with rails and much encouragement but was able to do it without physical assist.  Stood with min guard/assist and was able to ambulate 35' then 30' after a short rest.  A wheelchair follow was used as pt fatigued quickly and ambulates with a forward flexed posture.  He relies heavily on the walker for support.  General poor safety awareness and hand placements for transitions.  He is unsafe to ambulate at this time without +1 assist. Pt requested to return to bed but agreed to stay up in recliner with encouragement.   Follow Up Recommendations  SNF     Equipment Recommendations  None recommended by PT    Recommendations for Other Services       Precautions / Restrictions Precautions Precautions: Fall Restrictions Weight Bearing Restrictions: No    Mobility  Bed Mobility Overal bed mobility: Needs Assistance Bed Mobility: Supine to Sit     Supine to sit: Min guard     General bed mobility comments: heavy verbal cues but no direct assist, use of rails  Transfers Overall transfer level: Needs assistance Equipment used: Rolling walker (2 wheeled) Transfers: Sit to/from Stand Sit to Stand: Min guard;Min assist         General transfer comment: Poor hand placements and general safety.  light assist to stand  Ambulation/Gait Ambulation/Gait assistance: Min assist Ambulation Distance (Feet): 35 Feet Assistive device: Rolling walker (2 wheeled) Gait Pattern/deviations: Decreased step length -  right;Decreased step length - left Gait velocity: decreased Gait velocity interpretation: <1.8 ft/sec, indicative of risk for recurrent falls General Gait Details: significant flexed forward posture and heavy lean on walker.  Does correct briefly with verbal cues but returns to flexed posture after a few steps.   Stairs            Wheelchair Mobility    Modified Rankin (Stroke Patients Only)       Balance Overall balance assessment: Needs assistance Sitting-balance support: No upper extremity supported;Feet supported Sitting balance-Leahy Scale: Fair     Standing balance support: Bilateral upper extremity supported;During functional activity Standing balance-Leahy Scale: Fair Standing balance comment: Pt relies on BUE support and physical assist for static and dynamic activities                            Cognition Arousal/Alertness: Awake/alert Behavior During Therapy: WFL for tasks assessed/performed                                   General Comments: Pt has delayed responses at times.  Requires contstant verbal cues for safety with mobility.       Exercises Other Exercises Other Exercises: Seated BLE AROM for LAQ, marches, ankle pumps, and ab/adduction with moderate verbal and tactile cues to continue exercises and complete full ROM    General Comments        Pertinent Vitals/Pain Pain Assessment: No/denies  pain    Home Living                      Prior Function            PT Goals (current goals can now be found in the care plan section) Progress towards PT goals: Progressing toward goals    Frequency    Min 2X/week      PT Plan Current plan remains appropriate    Co-evaluation              AM-PAC PT "6 Clicks" Daily Activity  Outcome Measure  Difficulty turning over in bed (including adjusting bedclothes, sheets and blankets)?: A Little Difficulty moving from lying on back to sitting on the side  of the bed? : A Little Difficulty sitting down on and standing up from a chair with arms (e.g., wheelchair, bedside commode, etc,.)?: Unable Help needed moving to and from a bed to chair (including a wheelchair)?: A Little Help needed walking in hospital room?: A Little Help needed climbing 3-5 steps with a railing? : A Lot 6 Click Score: 15    End of Session Equipment Utilized During Treatment: Gait belt Activity Tolerance: Patient limited by fatigue;Patient tolerated treatment well Patient left: in chair;with chair alarm set;with call bell/phone within reach         Time: 0850-0913 PT Time Calculation (min) (ACUTE ONLY): 23 min  Charges:  $Gait Training: 8-22 mins $Therapeutic Exercise: 8-22 mins                    G Codes:       Danielle DessSarah Delorise Hunkele, PTA 05/08/17, 9:18 AM

## 2017-05-08 NOTE — Progress Notes (Signed)
Seth Harrison to be D/C'd back to group home per MD order.  Discussed prescriptions and follow up appointments with the Harrison. Prescriptions given to Harrison, medication list explained in detail. Pt verbalized understanding.  Allergies as of 05/08/2017   No Known Allergies     Medication List    STOP taking these medications   carvedilol 3.125 MG tablet Commonly known as:  COREG     TAKE these medications   acetaminophen 650 MG CR tablet Commonly known as:  TYLENOL Take 650 mg by mouth every 8 (eight) hours as needed for pain.   amLODipine 10 MG tablet Commonly known as:  NORVASC Take 1 tablet (10 mg total) daily by mouth. Start taking on:  05/09/2017   aspirin 325 MG tablet Take 325 mg by mouth daily.   baclofen 10 MG tablet Commonly known as:  LIORESAL Take 10 mg by mouth every 12 (twelve) hours as needed for muscle spasms.   cholecalciferol 1000 units tablet Commonly known as:  VITAMIN D Take 1,000 Units by mouth daily.   donepezil 10 MG tablet Commonly known as:  ARICEPT Take 10 mg by mouth at bedtime.   isosorbide mononitrate 60 MG 24 hr tablet Commonly known as:  IMDUR Take 1 tablet (60 mg total) daily by mouth. What changed:    medication strength  how much to take   lisinopril 40 MG tablet Commonly known as:  PRINIVIL,ZESTRIL Take 1 tablet (40 mg total) daily by mouth. What changed:    medication strength  how much to take   metoprolol tartrate 25 MG tablet Commonly known as:  LOPRESSOR Take 1 tablet (25 mg total) 2 (two) times daily by mouth.   mirtazapine 15 MG tablet Commonly known as:  REMERON Take 15 mg by mouth at bedtime.   omeprazole 40 MG capsule Commonly known as:  PRILOSEC Take 40 mg by mouth daily.   THEREMS-M Tabs Take 1 tablet by mouth daily.   vitamin B-12 1000 MCG tablet Commonly known as:  CYANOCOBALAMIN Take 1,000 mcg by mouth daily.       Vitals:   05/08/17 0948 05/08/17 1319  BP: 139/65 127/64  Pulse: (!) 58  (!) 51  Resp: 16   Temp: 98.1 F (36.7 C) 97.6 F (36.4 C)  SpO2: 100% 98%    Skin clean, dry and intact without evidence of skin break down, no evidence of skin tears noted. IV catheter discontinued intact. Site without signs and symptoms of complications. Dressing and pressure applied. Pt denies pain at this time. No complaints noted.  An After Visit Summary was printed and given to the Harrison and caregiver. Harrison escorted via WC, and D/C home via private auto.  Seth PatientJessica K Bertin Harrison

## 2017-05-08 NOTE — Progress Notes (Signed)
[facility] [employer] [facility] [place] [facility]   PATIENT NAME: Seth Harrison    MR#:  [id number]  DATE OF BIRTH:  06/16/1941  SUBJECTIVE:  CHIEF COMPLAINT:   Chief Complaint  Patient presents with  . Weakness   Sitting in a chair. Worked with PT today  REVIEW OF SYSTEMS:    Review of Systems  Constitutional: Positive for malaise/fatigue. Negative for chills and fever.  HENT: Negative for sore throat.   Eyes: Negative for blurred vision, double vision and pain.  Respiratory: Negative for cough, hemoptysis, shortness of breath and wheezing.   Cardiovascular: Positive for palpitations. Negative for chest pain, orthopnea and leg swelling.  Gastrointestinal: Negative for abdominal pain, constipation, diarrhea, heartburn, nausea and vomiting.  Genitourinary: Negative for dysuria and hematuria.  Musculoskeletal: Negative for back pain and joint pain.  Skin: Negative for rash.  Neurological: Positive for weakness. Negative for sensory change, speech change, focal weakness and headaches.  Endo/Heme/Allergies: Does not bruise/bleed easily.  Psychiatric/Behavioral: Negative for depression. The patient is not nervous/anxious.     DRUG ALLERGIES:  No Known Allergies  VITALS:  Blood pressure 139/65, pulse (!) 58, temperature 98.1 F (36.7 C), temperature source Oral, resp. rate 16, height 5\' 8"  (1.727 m), weight 94.8 kg (209 lb 1.6 oz), SpO2 100 %.  PHYSICAL EXAMINATION:   Physical Exam  GENERAL:  76 y.o.-year-old patient lying in the bed with no acute distress.  EYES: Pupils equal, round, reactive to light and accommodation. No scleral icterus. Extraocular muscles intact.  HEENT: Head atraumatic, normocephalic. Oropharynx and nasopharynx clear.  NECK:  Supple, no jugular venous distention. No thyroid enlargement, no tenderness.  LUNGS: Normal breath sounds bilaterally, no wheezing, rales, rhonchi. No use of accessory muscles of respiration.  CARDIOVASCULAR: S1, S2 normal. No  murmurs, rubs, or gallops.  ABDOMEN: Soft, nontender, nondistended. Bowel sounds present. No organomegaly or mass.  EXTREMITIES: No cyanosis, clubbing or edema b/l.    NEUROLOGIC: Cranial nerves II through XII are intact. No focal Motor or sensory deficits b/l.   PSYCHIATRIC: The patient is alert and awake SKIN: No obvious rash, lesion, or ulcer.   LABORATORY PANEL:   CBC Recent Labs  Lab 05/03/17 0601  WBC 7.8  HGB 12.5*  HCT 38.6*  PLT 208   ------------------------------------------------------------------------------------------------------------------ Chemistries  Recent Labs  Lab 05/02/17 0730 05/03/17 0601  NA 138 137  K 3.5 3.7  CL 106 105  CO2 23 24  GLUCOSE 115* 103*  BUN 25* 27*  CREATININE 1.89* 1.59*  CALCIUM 8.6* 8.9  MG 2.3  --    ------------------------------------------------------------------------------------------------------------------  Cardiac Enzymes Recent Labs  Lab 05/02/17 0730  TROPONINI 0.04*   ------------------------------------------------------------------------------------------------------------------  RADIOLOGY:  No results found.   ASSESSMENT AND PLAN:   *Supraventricular tachycardia No episodes overnight.  On metoprolol 50 twice daily. Continue telemetry monitoring. No further episodes.  *Uncontrolled hypertension. On Norvasc, Imdur, metoprolol. Hydralazine. D/C hydralazine today.  *  Hypokalemia.  Replaced potassium.  *  Essential hypertension.  Continue home medications.  Coreg changed to metoprolol.  *  Acute kidney injury over CKD stage III. Resolved with IV fluids. stopped  *Generalized progressive weakness.  Physical therapy to evaluate.  Skilled nursing facility at discharge.  *  History of dementia on Aricept  CODE STATUS: DNR  DVT Prophylaxis: SCDs  TOTAL TIME TAKING CARE OF THIS PATIENT: 35 minutes.   Waiting for SNF  Milagros LollSudini, Ronda Kazmi R M.D on 05/08/2017 at 10:34 AM  Between 7am to 6pm -  Pager -  [phone]  After 6pm go to [email or web address] - password [email or web address]  [facility] [place] [facility]  Office  [phone]  CC: Primary care physician; System, Pcp Not In  Note: This dictation was prepared with Dragon dictation along with smaller phrase technology. Any transcriptional errors that result from this process are unintentional.

## 2017-05-08 NOTE — Clinical Social Work Note (Addendum)
CSW spoke to [place] owner of group home [phone], and informed her that patient has been improving.  She said she will send one of her staff members to assess patient to see if he is appropriate to return back to group home.  CSW awaiting for response back from group home owner.  11:30am  CSW Received phone call from [place] Golden at [phone], who assessed patient and she thinks patient is okay to come back to group home.  CSW informed her that home health PT, OT, nursing, aide, and a social worker will be ordered to help patient to make progress.  Marlette said they have used [facility] in the past for home health.  She said they will be able to pick patient up later this afternoon, and he does have a walker already.  Patient to be d/c'ed today to [facility].  Patient and family agreeable to plans will transport facility transportation RN to call report.  Ervin KnackEric R. Jabari Swoveland, MSW, Theresia MajorsLCSWA [phone]  05/08/2017 10:28 AM

## 2017-05-08 NOTE — Discharge Summary (Signed)
[employer] [facility] [facility] [place] [facility]   PATIENT NAME: Seth FasterRobert Croke    MR#:  [id number]  DATE OF BIRTH:  03/07/1941  DATE OF ADMISSION:  04/29/2017 ADMITTING PHYSICIAN: Alford Highlandichard Wieting, MD  DATE OF DISCHARGE: 05/08/2017  PRIMARY CARE PHYSICIAN: System, Pcp Not In   ADMISSION DIAGNOSIS:  Swelling [R60.9] SVT (supraventricular tachycardia) (HCC) [I47.1] Weakness [R53.1] Pain [R52] Dyspnea, unspecified type [R06.00] Dementia without behavioral disturbance, unspecified dementia type [F03.90]  DISCHARGE DIAGNOSIS:  Active Problems:   SVT (supraventricular tachycardia) (HCC)   SECONDARY DIAGNOSIS:   Past Medical History:  Diagnosis Date  . Dementia   . Depression   . GERD (gastroesophageal reflux disease)   . Hypertension      ADMITTING HISTORY  HISTORY OF PRESENT ILLNESS:  Seth Harrison  is a 76 y.o. male came in for weakness.  He states that he cannot move his muscles to well.  His back is aching and can sit up.  Looks like he was seen in the ER a couple days ago.  The patient lives in a group home.  In the ER they were trying to place him.  He had an episode of SVT that required adenosine push.  He is currently in normal sinus rhythm.  Hospitalist services were contacted for further evaluation.  The patient is a poor historian and unable to give much history.  History obtained from old chart.  Group home said he cant walk.      HOSPITAL COURSE:   *Supraventricular tachycardia No episodes overnight.  On metoprolol 25 twice daily. No further episodes. stable  *Uncontrolled hypertension. On Norvasc, Imdur, metoprolol, Lisinopril. Can add Hydralazine as OP if still uncontrolled. Well controlled today  * Hypokalemia. Replaced potassium.  * Acute kidney injury over CKD stage III. Resolved with IV fluids. stopped  *Generalized progressive weakness.  Physical therapy to evaluate.  Will need HH PT/OT  * History of dementia on  Aricept  Stable or discharge back to group home  CONSULTS OBTAINED:    DRUG ALLERGIES:  No Known Allergies  DISCHARGE MEDICATIONS:   Current Discharge Medication List    START taking these medications   Details  amLODipine (NORVASC) 10 MG tablet Take 1 tablet (10 mg total) daily by mouth. Qty: 30 tablet, Refills: 0    metoprolol tartrate (LOPRESSOR) 25 MG tablet Take 1 tablet (25 mg total) 2 (two) times daily by mouth. Qty: 60 tablet, Refills: 0      CONTINUE these medications which have CHANGED   Details  isosorbide mononitrate (IMDUR) 60 MG 24 hr tablet Take 1 tablet (60 mg total) daily by mouth. Qty: 30 tablet, Refills: 0    lisinopril (PRINIVIL,ZESTRIL) 40 MG tablet Take 1 tablet (40 mg total) daily by mouth. Qty: 30 tablet, Refills: 0      CONTINUE these medications which have NOT CHANGED   Details  acetaminophen (TYLENOL) 650 MG CR tablet Take 650 mg by mouth every 8 (eight) hours as needed for pain.    aspirin 325 MG tablet Take 325 mg by mouth daily.    baclofen (LIORESAL) 10 MG tablet Take 10 mg by mouth every 12 (twelve) hours as needed for muscle spasms.    cholecalciferol (VITAMIN D) 1000 units tablet Take 1,000 Units by mouth daily.    donepezil (ARICEPT) 10 MG tablet Take 10 mg by mouth at bedtime.    mirtazapine (REMERON) 15 MG tablet Take 15 mg by mouth at bedtime.    Multiple Vitamins-Minerals (THEREMS-M) TABS Take  1 tablet by mouth daily.    omeprazole (PRILOSEC) 40 MG capsule Take 40 mg by mouth daily.    vitamin B-12 (CYANOCOBALAMIN) 1000 MCG tablet Take 1,000 mcg by mouth daily.      STOP taking these medications     carvedilol (COREG) 3.125 MG tablet         Today   VITAL SIGNS:  Blood pressure 139/65, pulse (!) 58, temperature 98.1 F (36.7 C), temperature source Oral, resp. rate 16, height 5\' 8"  (1.727 m), weight 94.8 kg (209 lb 1.6 oz), SpO2 100 %.  I/O:    Intake/Output Summary (Last 24 hours) at 05/08/2017 1135 Last data  filed at 05/08/2017 1000 Gross per 24 hour  Intake 898 ml  Output 100 ml  Net 798 ml    PHYSICAL EXAMINATION:  Physical Exam  GENERAL:  76 y.o.-year-old patient lying in the bed with no acute distress.  LUNGS: Normal breath sounds bilaterally, no wheezing, rales,rhonchi or crepitation. No use of accessory muscles of respiration.  CARDIOVASCULAR: S1, S2 normal. No murmurs, rubs, or gallops.  ABDOMEN: Soft, non-tender, non-distended. Bowel sounds present. No organomegaly or mass.  NEUROLOGIC: Moves all 4 extremities. PSYCHIATRIC: The patient is alert and oriented x 3.  SKIN: No obvious rash, lesion, or ulcer.   DATA REVIEW:   CBC Recent Labs  Lab 05/03/17 0601  WBC 7.8  HGB 12.5*  HCT 38.6*  PLT 208    Chemistries  Recent Labs  Lab 05/02/17 0730 05/03/17 0601  NA 138 137  K 3.5 3.7  CL 106 105  CO2 23 24  GLUCOSE 115* 103*  BUN 25* 27*  CREATININE 1.89* 1.59*  CALCIUM 8.6* 8.9  MG 2.3  --     Cardiac Enzymes Recent Labs  Lab 05/02/17 0730  TROPONINI 0.04*    Microbiology Results  Results for orders placed or performed during the hospital encounter of 04/29/17  MRSA PCR Screening     Status: None   Collection Time: 05/02/17 12:48 PM  Result Value Ref Range Status   MRSA by PCR NEGATIVE NEGATIVE Final    Comment:        The GeneXpert MRSA Assay (FDA approved for NASAL specimens only), is one component of a comprehensive MRSA colonization surveillance program. It is not intended to diagnose MRSA infection nor to guide or monitor treatment for MRSA infections.     RADIOLOGY:  No results found.  Follow up with PCP in 1 week.  Management plans discussed with the patient, family and they are in agreement.  CODE STATUS:     Code Status Orders  (From admission, onward)        Start     Ordered   05/02/17 0849  Do not attempt resuscitation (DNR)  Continuous    Question Answer Comment  In the event of cardiac or respiratory ARREST Do not  call a "code blue"   In the event of cardiac or respiratory ARREST Do not perform Intubation, CPR, defibrillation or ACLS   In the event of cardiac or respiratory ARREST Use medication by any route, position, wound care, and other measures to relive pain and suffering. May use oxygen, suction and manual treatment of airway obstruction as needed for comfort.   Comments nurse may pronounce      05/02/17 0849    Code Status History    Date Active Date Inactive Code Status Order ID Comments User Context   This patient has a current code status but no historical  code status.    Advance Directive Documentation     Most Recent Value  Type of Advance Directive  Out of facility DNR (pink MOST or yellow form)  Pre-existing out of facility DNR order (yellow form or pink MOST form)  No data  "MOST" Form in Place?  No data      TOTAL TIME TAKING CARE OF THIS PATIENT ON DAY OF DISCHARGE: more than 30 minutes.   Milagros LollSudini, Ricke Kimoto R M.D on 05/08/2017 at 11:35 AM  Between 7am to 6pm - Pager - [phone]  After 6pm go to [email or web address] - password [email or web address]  [facility]  Office  [phone]  CC: Primary care physician; System, Pcp Not In  Note: This dictation was prepared with Dragon dictation along with smaller phrase technology. Any transcriptional errors that result from this process are unintentional.

## 2018-04-25 ENCOUNTER — Emergency Department
Admission: EM | Admit: 2018-04-25 | Discharge: 2018-04-25 | Disposition: A | Discharge status: Home / Self Care | Payer: Medicare Other | Attending: Emergency Medicine | Admitting: Emergency Medicine

## 2018-04-25 ENCOUNTER — Other Ambulatory Visit: Payer: Self-pay

## 2018-04-25 ENCOUNTER — Emergency Department: Payer: Medicare Other

## 2018-04-25 ENCOUNTER — Encounter: Payer: Self-pay | Admitting: Emergency Medicine

## 2018-04-25 DIAGNOSIS — Z79899 Other long term (current) drug therapy: Secondary | ICD-10-CM | POA: Diagnosis not present

## 2018-04-25 DIAGNOSIS — R0600 Dyspnea, unspecified: Secondary | ICD-10-CM

## 2018-04-25 DIAGNOSIS — F17210 Nicotine dependence, cigarettes, uncomplicated: Secondary | ICD-10-CM | POA: Insufficient documentation

## 2018-04-25 DIAGNOSIS — R062 Wheezing: Secondary | ICD-10-CM | POA: Insufficient documentation

## 2018-04-25 DIAGNOSIS — F0390 Unspecified dementia without behavioral disturbance: Secondary | ICD-10-CM | POA: Insufficient documentation

## 2018-04-25 DIAGNOSIS — R600 Localized edema: Secondary | ICD-10-CM | POA: Diagnosis not present

## 2018-04-25 DIAGNOSIS — Z7982 Long term (current) use of aspirin: Secondary | ICD-10-CM | POA: Diagnosis not present

## 2018-04-25 DIAGNOSIS — I10 Essential (primary) hypertension: Secondary | ICD-10-CM | POA: Insufficient documentation

## 2018-04-25 LAB — CBC
HCT: 37.4 % — ABNORMAL LOW (ref 39.0–52.0)
HEMOGLOBIN: 11.7 g/dL — AB (ref 13.0–17.0)
MCH: 28.1 pg (ref 26.0–34.0)
MCHC: 31.3 g/dL (ref 30.0–36.0)
MCV: 89.7 fL (ref 80.0–100.0)
PLATELETS: 249 10*3/uL (ref 150–400)
RBC: 4.17 MIL/uL — AB (ref 4.22–5.81)
RDW: 14 % (ref 11.5–15.5)
WBC: 7.3 10*3/uL (ref 4.0–10.5)
nRBC: 0 % (ref 0.0–0.2)

## 2018-04-25 LAB — BASIC METABOLIC PANEL
ANION GAP: 11 (ref 5–15)
BUN: 23 mg/dL (ref 8–23)
CALCIUM: 8.7 mg/dL — AB (ref 8.9–10.3)
CO2: 24 mmol/L (ref 22–32)
Chloride: 106 mmol/L (ref 98–111)
Creatinine, Ser: 1.89 mg/dL — ABNORMAL HIGH (ref 0.61–1.24)
GFR calc Af Amer: 38 mL/min — ABNORMAL LOW (ref >60)
GFR calc non Af Amer: 33 mL/min — ABNORMAL LOW (ref >60)
Glucose, Bld: 107 mg/dL — ABNORMAL HIGH (ref 70–99)
POTASSIUM: 3.7 mmol/L (ref 3.5–5.1)
SODIUM: 141 mmol/L (ref 135–145)

## 2018-04-25 LAB — BRAIN NATRIURETIC PEPTIDE: B NATRIURETIC PEPTIDE 5: 22 pg/mL (ref 0.0–100.0)

## 2018-04-25 LAB — TROPONIN I: TROPONIN I: 0.03 ng/mL — AB (ref <0.03)

## 2018-04-25 NOTE — ED Notes (Signed)
Called Seth Harrison with group home. She also confirms pt does not have guardian.  Someone from group home will be coming to pick pt up

## 2018-04-25 NOTE — Discharge Instructions (Addendum)
You are evaluated for shortness of breath, and your exam and evaluation overall reassuring in the emergency department today.  Return to the emergency department immediately for any worsening condition including chest pain, palpitations, dizziness passing out, fever, cough, nor worsening trouble breathing, or any other symptoms concerning to you.

## 2018-04-25 NOTE — ED Notes (Signed)
As pt has no guardian pt signed discharge consent.  Also printed copy and will have group home staff sign a copy and place in medical records box since pt has dementia.

## 2018-04-25 NOTE — ED Provider Notes (Signed)
[facility] Emergency Department Provider Note ____________________________________________   I have reviewed the triage vital signs and the triage nursing note.  HISTORY  Chief Complaint Shortness of Breath   Historian Level 5 Caveat History Limited by dementia History obtained by report from group home staff  HPI Seth Harrison is a 77 y.o. male brought in by EMS where they were called out for complaint of shortness of breath.  Apparently he had complained about being short of breath and having wheeze.  Right now he is not complaining of shortness of breath or wheeze.  Denies chest pain.  But a poor historian, unclear whether not he has a history of CHF.  States that his legs are usually swollen and they are not worse than previous.  Denies use of inhaler.  Patient is really under able to describe what he was doing when he had the wheezing or if it is any worse lying flat or walking.     Past Medical History:  Diagnosis Date  . Dementia (HCC)   . Depression   . GERD (gastroesophageal reflux disease)   . Hypertension     Patient Active Problem List   Diagnosis Date Noted  . SVT (supraventricular tachycardia) (HCC) 05/02/2017    Past Surgical History:  Procedure Laterality Date  . FACIAL COSMETIC SURGERY      Prior to Admission medications   Medication Sig Start Date End Date Taking? Authorizing Provider  acetaminophen (TYLENOL) 650 MG CR tablet Take 650 mg by mouth every 8 (eight) hours as needed for pain.    [provider]  amLODipine (NORVASC) 10 MG tablet Take 1 tablet (10 mg total) daily by mouth. 05/09/17   Milagros Loll, MD  aspirin 325 MG tablet Take 325 mg by mouth daily.    [provider]  baclofen (LIORESAL) 10 MG tablet Take 10 mg by mouth every 12 (twelve) hours as needed for muscle spasms.    [provider]  cholecalciferol (VITAMIN D) 1000 units tablet Take 1,000 Units by mouth daily.    [provider]  donepezil (ARICEPT) 10 MG tablet Take 10 mg by mouth at bedtime.    [provider]  isosorbide mononitrate (IMDUR) 60 MG 24 hr tablet Take 1 tablet (60 mg total) daily by mouth. 05/08/17   Milagros Loll, MD  lisinopril (PRINIVIL,ZESTRIL) 40 MG tablet Take 1 tablet (40 mg total) daily by mouth. 05/08/17   Milagros Loll, MD  metoprolol tartrate (LOPRESSOR) 25 MG tablet Take 1 tablet (25 mg total) 2 (two) times daily by mouth. 05/08/17   Milagros Loll, MD  mirtazapine (REMERON) 15 MG tablet Take 15 mg by mouth at bedtime.    [provider]  Multiple Vitamins-Minerals (THEREMS-M) TABS Take 1 tablet by mouth daily.    [provider]  omeprazole (PRILOSEC) 40 MG capsule Take 40 mg by mouth daily.    [provider]  vitamin B-12 (CYANOCOBALAMIN) 1000 MCG tablet Take 1,000 mcg by mouth daily.    [provider]    No Known Allergies  Family History  Problem Relation Age of Onset  . Hypertension Mother     Social History Social History   Tobacco Use  . Smoking status: Current Every Day Smoker    Packs/day: 1.00    Types: Cigarettes  . Smokeless tobacco: Never Used  Substance Use Topics  . Alcohol use: No  . Drug use: No    Review of Systems  Constitutional: Negative for fever.  Eyes: Negative for visual changes. ENT: Negative for sore throat. Cardiovascular: Negative for chest pain. Respiratory: Positive as per HPI for shortness of breath. Gastrointestinal: Negative for abdominal pain, vomiting and diarrhea. Genitourinary: Negative for dysuria. Musculoskeletal: Negative for back pain. Skin: Negative for rash. Neurological: Negative for headache.  ____________________________________________   PHYSICAL EXAM:  VITAL SIGNS: ED Triage Vitals  Enc Vitals Group     BP 04/25/18 1228 137/68     Pulse Rate 04/25/18 1228 (!) 55     Resp 04/25/18 1228 18     Temp 04/25/18 1228 98.1 F (36.7 C)     Temp Source  04/25/18 1228 Oral     SpO2 04/25/18 1228 98 %     Weight 04/25/18 1224 240 lb (108.9 kg)     Height 04/25/18 1224 5\' 9"  (1.753 m)     Head Circumference --      Peak Flow --      Pain Score 04/25/18 1224 0     Pain Loc --      Pain Edu? --      Excl. in GC? --      Constitutional: Alert and cooperative, poor historian.  HEENT      Head: Normocephalic and atraumatic.      Eyes: Conjunctivae are normal. Pupils equal and round.       Ears:         Nose: No congestion/rhinnorhea.      Mouth/Throat: Mucous membranes are moist.      Neck: No stridor. Cardiovascular/Chest: Normal rate, regular rhythm.  No murmurs, rubs, or gallops. Respiratory: Normal respiratory effort without tachypnea nor retractions.  No rhonchi.  No wheezing.  Mild tight breath sounds. Gastrointestinal: Soft. No distention, no guarding, no rebound. Nontender.  Obese, abdomen somewhat tight. Genitourinary/rectal:Deferred Musculoskeletal: Nontender with normal range of motion in all extremities. No joint effusions.  No lower extremity tenderness.  2+ lower extremity edema bilateral lower extremities. Neurologic:  Normal speech and language. No gross or focal neurologic deficits are appreciated. Skin:  Skin is warm, dry and intact. No rash noted. Psychiatric: Mood and affect are normal.  Calm and cooperative.   ____________________________________________  LABS (pertinent positives/negatives) I, Governor Rooks, MD the attending physician have reviewed the labs noted below.  Labs Reviewed  BASIC METABOLIC PANEL - Abnormal; Notable for the following components:      Result Value   Glucose, Bld 107 (*)    Creatinine, Ser 1.89 (*)    Calcium 8.7 (*)    GFR calc non Af Amer 33 (*)    GFR calc Af Amer 38 (*)    All other components within normal limits  CBC - Abnormal; Notable for the following components:   RBC 4.17 (*)    Hemoglobin 11.7 (*)    HCT 37.4 (*)    All other components within normal limits   TROPONIN I - Abnormal; Notable for the following components:   Troponin I 0.03 (*)    All other components within normal limits  BRAIN NATRIURETIC PEPTIDE    ____________________________________________    EKG I, Governor Rooks, MD, the attending physician have personally viewed and interpreted all ECGs.  56 bpm.  Normal sinus rhythm.  Narrow QS renal axis.  Nonspecific T wave ____________________________________________  RADIOLOGY   Chest x-ray two-view, viewed by myself, reviewed radiologist interpretation:  IMPRESSION: No active cardiopulmonary disease.  __________________________________________  PROCEDURES  Procedure(s) performed: None  Procedures  Critical Care performed: None   ____________________________________________  ED  COURSE / ASSESSMENT AND PLAN  Pertinent labs & imaging results that were available during my care of the patient were reviewed by me and considered in my medical decision making (see chart for details).   No hypoxia.  No fevers.  Patient reports shortness of breath by history, but currently not short of breath.  At one point he did tell me that it feels a little tight.  He does have lower extremity pitting edema, and I suspect may be he is having some pulmonary edema.  No indication for hospitalization.  No chest pain.  EKG is overall reassuring although nonspecific.  Symptoms do not seem consistent with COPD or asthma or pneumonia.  Chest x-ray clear.  No hypoxia or tachycardia or fever.  No pleuritic chest pain.  No ongoing shortness of breath.  Not suspicious for PE.  PERC negative.  Laboratory evaluation is reassuring with a normal BNP.  He has a chronic renal failure which is similar to baseline.  His troponin is 0.03 any as a baseline 0.03-0.04.  It sounds like at the time he was having some wheezing or cough and maybe he was having allergies or slight bronchitis although right now he is completely clear.  Okay for outpatient  follow-up.  Discussed return precautions.    CONSULTATIONS:  None  Patient / Family / Caregiver informed of clinical course, medical decision-making process, and agree with plan.   I discussed return precautions, follow-up instructions, and discharge instructions with patient and/or family.  Discharge Instructions :  You are evaluated for shortness of breath, and your exam and evaluation overall reassuring in the emergency department today.  Return to the emergency department immediately for any worsening condition including chest pain, palpitations, dizziness passing out, fever, cough, nor worsening trouble breathing, or any other symptoms concerning to you.   ___________________________________________   FINAL CLINICAL IMPRESSION(S) / ED DIAGNOSES   Final diagnoses:  Dyspnea, unspecified type      ___________________________________________         Note: This dictation was prepared with Dragon dictation. Any transcriptional errors that result from this process are unintentional    Governor Rooks, MD 04/25/18 1354

## 2018-04-25 NOTE — ED Triage Notes (Addendum)
Here for [facility] per group home report to EMS. Pt denies any SHOB. Per EMS they were informed he was [facility] last couple days and group home staff noticed wheezing today. Breath sounds clear without wheezing on auscultation.  Pt denies having legal guardian

## 2018-04-25 NOTE — ED Notes (Signed)
Date and time results received: 04/25/18 1308 (use smartphrase ".now" to insert current time)  Test: troponin Critical Value: 0.03  Name of Provider Notified: lord

## 2018-08-28 ENCOUNTER — Emergency Department
Admission: EM | Admit: 2018-08-28 | Discharge: 2018-08-29 | Disposition: A | Discharge status: Home / Self Care | Payer: Medicare Other | Attending: Emergency Medicine | Admitting: Emergency Medicine

## 2018-08-28 DIAGNOSIS — Z79899 Other long term (current) drug therapy: Secondary | ICD-10-CM | POA: Diagnosis not present

## 2018-08-28 DIAGNOSIS — N183 Chronic kidney disease, stage 3 (moderate): Secondary | ICD-10-CM | POA: Insufficient documentation

## 2018-08-28 DIAGNOSIS — Z7982 Long term (current) use of aspirin: Secondary | ICD-10-CM | POA: Insufficient documentation

## 2018-08-28 DIAGNOSIS — E869 Volume depletion, unspecified: Secondary | ICD-10-CM

## 2018-08-28 DIAGNOSIS — I129 Hypertensive chronic kidney disease with stage 1 through stage 4 chronic kidney disease, or unspecified chronic kidney disease: Secondary | ICD-10-CM | POA: Diagnosis not present

## 2018-08-28 DIAGNOSIS — N289 Disorder of kidney and ureter, unspecified: Secondary | ICD-10-CM | POA: Insufficient documentation

## 2018-08-28 DIAGNOSIS — N189 Chronic kidney disease, unspecified: Secondary | ICD-10-CM

## 2018-08-28 DIAGNOSIS — F0390 Unspecified dementia without behavioral disturbance: Secondary | ICD-10-CM | POA: Diagnosis not present

## 2018-08-28 DIAGNOSIS — R7989 Other specified abnormal findings of blood chemistry: Secondary | ICD-10-CM | POA: Diagnosis present

## 2018-08-28 DIAGNOSIS — F17210 Nicotine dependence, cigarettes, uncomplicated: Secondary | ICD-10-CM | POA: Insufficient documentation

## 2018-08-28 LAB — COMPREHENSIVE METABOLIC PANEL
ALT: 36 U/L (ref 0–44)
AST: 36 U/L (ref 15–41)
Albumin: 4.4 g/dL (ref 3.5–5.0)
Alkaline Phosphatase: 65 U/L (ref 38–126)
Anion gap: 10 (ref 5–15)
BILIRUBIN TOTAL: 0.2 mg/dL — AB (ref 0.3–1.2)
BUN: 34 mg/dL — AB (ref 8–23)
CO2: 22 mmol/L (ref 22–32)
Calcium: 8.8 mg/dL — ABNORMAL LOW (ref 8.9–10.3)
Chloride: 107 mmol/L (ref 98–111)
Creatinine, Ser: 2.57 mg/dL — ABNORMAL HIGH (ref 0.61–1.24)
GFR calc Af Amer: 27 mL/min — ABNORMAL LOW (ref >60)
GFR, EST NON AFRICAN AMERICAN: 23 mL/min — AB (ref >60)
Glucose, Bld: 148 mg/dL — ABNORMAL HIGH (ref 70–99)
POTASSIUM: 3.8 mmol/L (ref 3.5–5.1)
Sodium: 139 mmol/L (ref 135–145)
TOTAL PROTEIN: 8.3 g/dL — AB (ref 6.5–8.1)

## 2018-08-28 LAB — CBC
HEMATOCRIT: 39.2 % (ref 39.0–52.0)
Hemoglobin: 12.3 g/dL — ABNORMAL LOW (ref 13.0–17.0)
MCH: 28.2 pg (ref 26.0–34.0)
MCHC: 31.4 g/dL (ref 30.0–36.0)
MCV: 89.9 fL (ref 80.0–100.0)
Platelets: 294 10*3/uL (ref 150–400)
RBC: 4.36 MIL/uL (ref 4.22–5.81)
RDW: 13.4 % (ref 11.5–15.5)
WBC: 8.1 10*3/uL (ref 4.0–10.5)
nRBC: 0 % (ref 0.0–0.2)

## 2018-08-28 MED ORDER — SODIUM CHLORIDE 0.9 % IV BOLUS
1000.0000 mL | Freq: Once | INTRAVENOUS | Status: AC
  Administered 2018-08-29: 1000 mL via INTRAVENOUS

## 2018-08-28 NOTE — ED Triage Notes (Signed)
Patient coming POV from [facility] for abnormal labs. [facility]. Per facility rep, bloodwork as follows:  GFR 29.7 (previously 44.5) Creatine 2.7 (previously 1.9) Potassium 4, sodium 140, calcium 9.2

## 2018-08-28 NOTE — ED Notes (Signed)
Patient to ED from [facility], [phone] is the house number, house manager is Linzie Collin and can be reached at [phone]. Seen at the [facility] today for a regular appointment and they did lab work. They called back tonight and said he need to come to ER and get some fluids because his labs were abnormal. Patient is alert and acting normally per staff member. History of dementia.

## 2018-08-29 LAB — BASIC METABOLIC PANEL
Anion gap: 8 (ref 5–15)
BUN: 32 mg/dL — ABNORMAL HIGH (ref 8–23)
CO2: 23 mmol/L (ref 22–32)
Calcium: 8.2 mg/dL — ABNORMAL LOW (ref 8.9–10.3)
Chloride: 109 mmol/L (ref 98–111)
Creatinine, Ser: 2.28 mg/dL — ABNORMAL HIGH (ref 0.61–1.24)
GFR calc Af Amer: 31 mL/min — ABNORMAL LOW (ref >60)
GFR calc non Af Amer: 27 mL/min — ABNORMAL LOW (ref >60)
Glucose, Bld: 104 mg/dL — ABNORMAL HIGH (ref 70–99)
Potassium: 3.9 mmol/L (ref 3.5–5.1)
Sodium: 140 mmol/L (ref 135–145)

## 2018-08-29 LAB — URINALYSIS, COMPLETE (UACMP) WITH MICROSCOPIC
Bacteria, UA: NONE SEEN
Bilirubin Urine: NEGATIVE
GLUCOSE, UA: NEGATIVE mg/dL
HGB URINE DIPSTICK: NEGATIVE
Ketones, ur: NEGATIVE mg/dL
Leukocytes,Ua: NEGATIVE
NITRITE: NEGATIVE
Protein, ur: NEGATIVE mg/dL
SPECIFIC GRAVITY, URINE: 1.014 (ref 1.005–1.030)
pH: 5 (ref 5.0–8.0)

## 2018-08-29 MED ORDER — SODIUM CHLORIDE 0.9 % IV BOLUS
1000.0000 mL | Freq: Once | INTRAVENOUS | Status: AC
  Administered 2018-08-29: 1000 mL via INTRAVENOUS

## 2018-08-29 NOTE — ED Provider Notes (Signed)
[facility] Emergency Department Provider Note  ____________________________________________   First MD Initiated Contact with Patient 08/28/18 2354     (approximate)  I have reviewed the triage vital signs and the nursing notes.   HISTORY  Chief Complaint Abnormal Lab    HPI Seth Harrison is a 78 y.o. male with history of mild to moderate dementia but who generally functions well and lives at a group home.  He presents for evaluation of abnormal labs.  He goes to the [place] and had a routine appointment where they determined that his creatinine was elevated about 2.7 and his baseline is usually around 1.8 or 1.9.  They told the group home staff to take him to the nearest emergency department for some IV fluids.  The patient says he feels fine.  He denies fever/chills, chest pain, shortness of breath, nausea, vomiting, abdominal pain, and dysuria.  He states that he is not eating or drinking very much because he does not have much of an appetite.  He is in no distress and he denies any symptoms so he cannot quantify whether or not they were mild, moderate, nor severe.  He is cheerful and pleasant and cooperative.     Past Medical History:  Diagnosis Date  . Dementia (HCC)   . Depression   . GERD (gastroesophageal reflux disease)   . Hypertension     Patient Active Problem List   Diagnosis Date Noted  . SVT (supraventricular tachycardia) (HCC) 05/02/2017    Past Surgical History:  Procedure Laterality Date  . FACIAL COSMETIC SURGERY      Prior to Admission medications   Medication Sig Start Date End Date Taking? Authorizing Provider  acetaminophen (TYLENOL) 650 MG CR tablet Take 650 mg by mouth every 8 (eight) hours as needed for pain.    [provider]  amLODipine (NORVASC) 10 MG tablet Take 1 tablet (10 mg total) daily by mouth. 05/09/17   Milagros Loll, MD  aspirin 325 MG tablet Take 325 mg by mouth daily.    [provider]    baclofen (LIORESAL) 10 MG tablet Take 10 mg by mouth every 12 (twelve) hours as needed for muscle spasms.    [provider]  cholecalciferol (VITAMIN D) 1000 units tablet Take 1,000 Units by mouth daily.    [provider]  donepezil (ARICEPT) 10 MG tablet Take 10 mg by mouth at bedtime.    [provider]  isosorbide mononitrate (IMDUR) 60 MG 24 hr tablet Take 1 tablet (60 mg total) daily by mouth. 05/08/17   Milagros Loll, MD  lisinopril (PRINIVIL,ZESTRIL) 40 MG tablet Take 1 tablet (40 mg total) daily by mouth. 05/08/17   Milagros Loll, MD  metoprolol tartrate (LOPRESSOR) 25 MG tablet Take 1 tablet (25 mg total) 2 (two) times daily by mouth. 05/08/17   Milagros Loll, MD  mirtazapine (REMERON) 15 MG tablet Take 15 mg by mouth at bedtime.    [provider]  Multiple Vitamins-Minerals (THEREMS-M) TABS Take 1 tablet by mouth daily.    [provider]  omeprazole (PRILOSEC) 40 MG capsule Take 40 mg by mouth daily.    [provider]  vitamin B-12 (CYANOCOBALAMIN) 1000 MCG tablet Take 1,000 mcg by mouth daily.    [provider]    Allergies Patient has no known allergies.  Family History  Problem Relation Age of Onset  . Hypertension Mother     Social History Social History   Tobacco Use  .  Smoking status: Current Every Day Smoker    Packs/day: 1.00    Types: Cigarettes  . Smokeless tobacco: Never Used  Substance Use Topics  . Alcohol use: No  . Drug use: No    Review of Systems Constitutional: No fever/chills Eyes: No visual changes. ENT: No sore throat. Cardiovascular: Denies chest pain. Respiratory: Denies shortness of breath. Gastrointestinal: No abdominal pain.  No nausea, no vomiting.  No diarrhea.  No constipation. Genitourinary: Negative for dysuria. Musculoskeletal: Negative for neck pain.  Negative for back pain. Integumentary: Negative for rash. Neurological: Negative for headaches, focal weakness  or numbness.   ____________________________________________   PHYSICAL EXAM:  VITAL SIGNS: ED Triage Vitals [08/28/18 2002]  Enc Vitals Group     BP 137/65     Pulse Rate 76     Resp 18     Temp 98.6 F (37 C)     Temp Source Oral     SpO2 99 %     Weight 95.3 kg (210 lb)     Height 1.746 m (5' 8.75")     Head Circumference      Peak Flow      Pain Score 0     Pain Loc      Pain Edu?      Excl. in GC?     Constitutional: Alert and oriented. Well appearing and in no acute distress. Eyes: Conjunctivae are normal.  Head: Atraumatic. Nose: No congestion/rhinnorhea. Mouth/Throat: Mucous membranes are moist. Neck: No stridor.  No meningeal signs.   Cardiovascular: Normal rate, regular rhythm. Good peripheral circulation. Grossly normal heart sounds. Respiratory: Normal respiratory effort.  No retractions. Lungs CTAB. Gastrointestinal: Soft and nontender. No distention.  Musculoskeletal: No lower extremity tenderness nor edema. No gross deformities of extremities. Neurologic:  Normal speech and language. No gross focal neurologic deficits are appreciated.  Skin:  Skin is warm, dry and intact. No rash noted. Psychiatric: Mood and affect are normal. Speech and behavior are normal.  ____________________________________________   LABS (all labs ordered are listed, but only abnormal results are displayed)  Labs Reviewed  CBC - Abnormal; Notable for the following components:      Result Value   Hemoglobin 12.3 (*)    All other components within normal limits  COMPREHENSIVE METABOLIC PANEL - Abnormal; Notable for the following components:   Glucose, Bld 148 (*)    BUN 34 (*)    Creatinine, Ser 2.57 (*)    Calcium 8.8 (*)    Total Protein 8.3 (*)    Total Bilirubin 0.2 (*)    GFR calc non Af Amer 23 (*)    GFR calc Af Amer 27 (*)    All other components within normal limits  URINALYSIS, COMPLETE (UACMP) WITH MICROSCOPIC - Abnormal; Notable for the following components:    Color, Urine STRAW (*)    APPearance CLEAR (*)    All other components within normal limits  BASIC METABOLIC PANEL - Abnormal; Notable for the following components:   Glucose, Bld 104 (*)    BUN 32 (*)    Creatinine, Ser 2.28 (*)    Calcium 8.2 (*)    GFR calc non Af Amer 27 (*)    GFR calc Af Amer 31 (*)    All other components within normal limits   ____________________________________________  EKG  No indication for EKG ____________________________________________  RADIOLOGY   ED MD interpretation: No indication for imaging  Official radiology report(s): No results found.  ____________________________________________   PROCEDURES  Procedure(s) performed (including Critical Care):  Procedures   ____________________________________________   INITIAL IMPRESSION / MDM / ASSESSMENT AND PLAN / ED COURSE  As part of my medical decision making, I reviewed the following data within the electronic MEDICAL RECORD NUMBER Nursing notes reviewed and incorporated, Labs reviewed , Old chart reviewed and Notes from prior ED visits       Differential diagnosis includes, but is not limited to, acute kidney injury in the setting of decreased oral intake, medication side effect, renal ischemia, etc.  The patient is very well-appearing with no distress and no symptoms.  He is capable of tolerating oral intake and did so in the emergency department.  I think he is just simply not eating or drinking enough.  He has reportedly had some recent medication changes but this is mostly his chlorthalidone and I doubt that this is directly related to the kidney injury.  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  I am going to give him 2 L of IV fluids and then recheck a metabolic panel to see if his creatinine is improving at all.  If so, I think he would be  appropriate for discharge and outpatient follow-up at the [place].  He agrees with this plan.  I am also encouraging him to eat and drink while he is here.  Clinical Course as of Feb 29 0923  Sat Aug 29, 2018  0630 Awaiting repeat BMP   [CF]  0721 Creatinine has improved after his second liter of fluids and is almost back to his baseline.  Patient has been stable overnight.  Will discharge for outpatient follow up.   [CF]    Clinical Course User Index [CF] Loleta Rose, MD    ____________________________________________  FINAL CLINICAL IMPRESSION(S) / ED DIAGNOSES  Final diagnoses:  Acute on chronic renal insufficiency  Volume depletion     MEDICATIONS GIVEN DURING THIS VISIT:  Medications  sodium chloride 0.9 % bolus 1,000 mL (0 mLs Intravenous Stopped 08/29/18 0446)  sodium chloride 0.9 % bolus 1,000 mL (0 mLs Intravenous Stopped 08/29/18 6160)     ED Discharge Orders    None       Note:  This document was prepared using Dragon voice recognition software and may include unintentional dictation errors.   Loleta Rose, MD 08/29/18 [phone]

## 2018-08-29 NOTE — Discharge Instructions (Signed)
Your labs demonstrate that your kidneys were not working as well as usual, most likely because you have not been drinking enough fluids.  After we gave you IV fluids your kidney function improved.  It is okay for you to go home but you need to drink plenty of fluids and follow-up with your regular doctor to have some repeat lab work next week.  If your kidneys are still not working well at that time, you should go to the [facility] hospital for possible admission and further evaluation by the kidney doctors.    Return to the emergency department if you develop new or worsening symptoms that concern you.

## 2018-08-29 NOTE — ED Notes (Signed)
This RN called [phone] Seth Harrison to inform her that pt is up for discharge. She stated someone would be at [facility] to pick pt up within the hour.

## 2019-01-09 ENCOUNTER — Inpatient Hospital Stay
Admission: EM | Admit: 2019-01-09 | Discharge: 2019-01-11 | DRG: 683 | Disposition: A | Discharge status: Home Health | Payer: Medicare Other | Attending: Internal Medicine | Admitting: Internal Medicine

## 2019-01-09 ENCOUNTER — Other Ambulatory Visit: Payer: Self-pay

## 2019-01-09 DIAGNOSIS — R531 Weakness: Secondary | ICD-10-CM | POA: Diagnosis not present

## 2019-01-09 DIAGNOSIS — Z8249 Family history of ischemic heart disease and other diseases of the circulatory system: Secondary | ICD-10-CM

## 2019-01-09 DIAGNOSIS — F17210 Nicotine dependence, cigarettes, uncomplicated: Secondary | ICD-10-CM | POA: Diagnosis present

## 2019-01-09 DIAGNOSIS — N179 Acute kidney failure, unspecified: Secondary | ICD-10-CM | POA: Diagnosis not present

## 2019-01-09 DIAGNOSIS — Z66 Do not resuscitate: Secondary | ICD-10-CM | POA: Diagnosis present

## 2019-01-09 DIAGNOSIS — Z7982 Long term (current) use of aspirin: Secondary | ICD-10-CM | POA: Diagnosis not present

## 2019-01-09 DIAGNOSIS — T675XXA Heat exhaustion, unspecified, initial encounter: Secondary | ICD-10-CM | POA: Diagnosis present

## 2019-01-09 DIAGNOSIS — Z79899 Other long term (current) drug therapy: Secondary | ICD-10-CM | POA: Diagnosis not present

## 2019-01-09 DIAGNOSIS — N189 Chronic kidney disease, unspecified: Secondary | ICD-10-CM | POA: Diagnosis present

## 2019-01-09 DIAGNOSIS — X30XXXA Exposure to excessive natural heat, initial encounter: Secondary | ICD-10-CM

## 2019-01-09 DIAGNOSIS — F0390 Unspecified dementia without behavioral disturbance: Secondary | ICD-10-CM | POA: Diagnosis present

## 2019-01-09 DIAGNOSIS — E860 Dehydration: Secondary | ICD-10-CM | POA: Diagnosis present

## 2019-01-09 DIAGNOSIS — I129 Hypertensive chronic kidney disease with stage 1 through stage 4 chronic kidney disease, or unspecified chronic kidney disease: Secondary | ICD-10-CM | POA: Diagnosis present

## 2019-01-09 DIAGNOSIS — K219 Gastro-esophageal reflux disease without esophagitis: Secondary | ICD-10-CM | POA: Diagnosis present

## 2019-01-09 DIAGNOSIS — Y92007 Garden or yard of unspecified non-institutional (private) residence as the place of occurrence of the external cause: Secondary | ICD-10-CM

## 2019-01-09 DIAGNOSIS — Z1159 Encounter for screening for other viral diseases: Secondary | ICD-10-CM | POA: Diagnosis not present

## 2019-01-09 DIAGNOSIS — F329 Major depressive disorder, single episode, unspecified: Secondary | ICD-10-CM | POA: Diagnosis present

## 2019-01-09 DIAGNOSIS — M6282 Rhabdomyolysis: Secondary | ICD-10-CM | POA: Diagnosis present

## 2019-01-09 DIAGNOSIS — N183 Chronic kidney disease, stage 3 (moderate): Secondary | ICD-10-CM | POA: Diagnosis present

## 2019-01-09 DIAGNOSIS — N289 Disorder of kidney and ureter, unspecified: Secondary | ICD-10-CM

## 2019-01-09 LAB — CK: Total CK: 420 U/L — ABNORMAL HIGH (ref 49–397)

## 2019-01-09 LAB — COMPREHENSIVE METABOLIC PANEL
ALT: 24 U/L (ref 0–44)
AST: 29 U/L (ref 15–41)
Albumin: 4.4 g/dL (ref 3.5–5.0)
Alkaline Phosphatase: 50 U/L (ref 38–126)
Anion gap: 14 (ref 5–15)
BUN: 43 mg/dL — ABNORMAL HIGH (ref 8–23)
CO2: 18 mmol/L — ABNORMAL LOW (ref 22–32)
Calcium: 9.7 mg/dL (ref 8.9–10.3)
Chloride: 108 mmol/L (ref 98–111)
Creatinine, Ser: 3.85 mg/dL — ABNORMAL HIGH (ref 0.61–1.24)
GFR calc Af Amer: 16 mL/min — ABNORMAL LOW (ref >60)
GFR calc non Af Amer: 14 mL/min — ABNORMAL LOW (ref >60)
Glucose, Bld: 157 mg/dL — ABNORMAL HIGH (ref 70–99)
Potassium: 4.6 mmol/L (ref 3.5–5.1)
Sodium: 140 mmol/L (ref 135–145)
Total Bilirubin: 0.1 mg/dL — ABNORMAL LOW (ref 0.3–1.2)
Total Protein: 8.2 g/dL — ABNORMAL HIGH (ref 6.5–8.1)

## 2019-01-09 LAB — CBC
HCT: 36.8 % — ABNORMAL LOW (ref 39.0–52.0)
Hemoglobin: 11.6 g/dL — ABNORMAL LOW (ref 13.0–17.0)
MCH: 28.1 pg (ref 26.0–34.0)
MCHC: 31.5 g/dL (ref 30.0–36.0)
MCV: 89.1 fL (ref 80.0–100.0)
Platelets: 290 10*3/uL (ref 150–400)
RBC: 4.13 MIL/uL — ABNORMAL LOW (ref 4.22–5.81)
RDW: 14.1 % (ref 11.5–15.5)
WBC: 12.6 10*3/uL — ABNORMAL HIGH (ref 4.0–10.5)
nRBC: 0 % (ref 0.0–0.2)

## 2019-01-09 LAB — SARS CORONAVIRUS 2 BY RT PCR (HOSPITAL ORDER, PERFORMED IN ~~LOC~~ HOSPITAL LAB): SARS Coronavirus 2: NEGATIVE

## 2019-01-09 MED ORDER — SODIUM CHLORIDE 0.9 % IV BOLUS
1000.0000 mL | Freq: Once | INTRAVENOUS | Status: AC
  Administered 2019-01-09: 19:00:00 1000 mL via INTRAVENOUS

## 2019-01-09 MED ORDER — ONDANSETRON HCL 4 MG/2ML IJ SOLN
4.0000 mg | Freq: Once | INTRAMUSCULAR | Status: AC
  Administered 2019-01-09: 4 mg via INTRAVENOUS
  Filled 2019-01-09: qty 2

## 2019-01-09 NOTE — ED Notes (Signed)
Pt resting in NAD. Waiting for bed assignment. No complaints or requests at this time. Notified group home that pt will be staying.

## 2019-01-09 NOTE — ED Triage Notes (Signed)
Pt comes EMS from [facility]. (possibly [facility]). Pt was sitting outside for many hours and they called because they thought he might have some heat exhaustion. Pt also seemed a little weak to them. CBG 178. Pt walked for EMS. Pt had x1 episode vomitting. Hx of dementia.

## 2019-01-09 NOTE — ED Provider Notes (Signed)
[facility] Emergency Department Provider Note  Time seen: 6:13 PM  I have reviewed the triage vital signs and the nursing notes.   HISTORY  Chief Complaint Altered Mental Status   HPI Seth Harrison is a 78 y.o. male with a past medical history of dementia, gastric reflux, hypertension, presents to the emergency department for evaluation.  According to EMS the patient lives at a group home, patient was outside for many hours today in the heat, was feeling a little weak in the group home staff members were concerned the patient could have "heat exhaustion."  Here the patient is awake and alert, he has no acute distress.  Patient does have a history of dementia and cannot contribute much to his review of systems but denies any acute abnormalities.  Past Medical History:  Diagnosis Date  . Dementia ([place])   . Depression   . GERD (gastroesophageal reflux disease)   . Hypertension     Patient Active Problem List   Diagnosis Date Noted  . SVT (supraventricular tachycardia) ([place]) 05/02/2017    Past Surgical History:  Procedure Laterality Date  . FACIAL COSMETIC SURGERY      Prior to Admission medications   Medication Sig Start Date End Date Taking? Authorizing Provider  acetaminophen (TYLENOL) 650 MG CR tablet Take 650 mg by mouth every 8 (eight) hours as needed for pain.    [provider]  amLODipine (NORVASC) 10 MG tablet Take 1 tablet (10 mg total) daily by mouth. 05/09/17   Hillary Bow, MD  aspirin 325 MG tablet Take 325 mg by mouth daily.    [provider]  baclofen (LIORESAL) 10 MG tablet Take 10 mg by mouth every 12 (twelve) hours as needed for muscle spasms.    [provider]  cholecalciferol (VITAMIN D) 1000 units tablet Take 1,000 Units by mouth daily.    [provider]  donepezil (ARICEPT) 10 MG tablet Take 10 mg by mouth at bedtime.    [provider]  isosorbide mononitrate (IMDUR) 60 MG 24 hr  tablet Take 1 tablet (60 mg total) daily by mouth. 05/08/17   Hillary Bow, MD  lisinopril (PRINIVIL,ZESTRIL) 40 MG tablet Take 1 tablet (40 mg total) daily by mouth. 05/08/17   Hillary Bow, MD  metoprolol tartrate (LOPRESSOR) 25 MG tablet Take 1 tablet (25 mg total) 2 (two) times daily by mouth. 05/08/17   Hillary Bow, MD  mirtazapine (REMERON) 15 MG tablet Take 15 mg by mouth at bedtime.    [provider]  Multiple Vitamins-Minerals (THEREMS-M) TABS Take 1 tablet by mouth daily.    [provider]  omeprazole (PRILOSEC) 40 MG capsule Take 40 mg by mouth daily.    [provider]  vitamin B-12 (CYANOCOBALAMIN) 1000 MCG tablet Take 1,000 mcg by mouth daily.    [provider]    No Known Allergies  Family History  Problem Relation Age of Onset  . Hypertension Mother     Social History Social History   Tobacco Use  . Smoking status: Current Every Day Smoker    Packs/day: 1.00    Types: Cigarettes  . Smokeless tobacco: Never Used  Substance Use Topics  . Alcohol use: No  . Drug use: No    Review of Systems Unable to obtain adequate/accurate review of systems secondary to baseline dementia.  ____________________________________________   PHYSICAL EXAM:  VITAL SIGNS: ED Triage Vitals  Enc Vitals Group     BP 01/09/19 1752 101/62  Pulse Rate 01/09/19 1752 76     Resp 01/09/19 1752 19     Temp 01/09/19 1752 98.3 F (36.8 C)     Temp Source 01/09/19 1752 Oral     SpO2 01/09/19 1752 96 %     Weight 01/09/19 1750 209 lb 7 oz (95 kg)     Height 01/09/19 1750 5\' 8"  (1.727 m)     Head Circumference --      Peak Flow --      Pain Score 01/09/19 1750 0     Pain Loc --      Pain Edu? --      Excl. in GC? --    Constitutional: Alert. Well appearing and in no distress. Eyes: Normal exam ENT      Head: Normocephalic and atraumatic.      Mouth/Throat: Mucous membranes are moist. Cardiovascular: Normal rate, regular rhythm.   Respiratory: Normal respiratory effort without tachypnea nor retractions. Breath sounds are clear  Gastrointestinal: Soft and nontender. No distention.   Musculoskeletal: Nontender with normal range of motion in all extremities.  Neurologic:  Normal speech and language. No gross focal neurologic deficits  Skin:  Skin is warm, dry and intact.  Psychiatric: Mood and affect are normal.  ____________________________________________    EKG  EKG viewed and interpreted by myself shows a normal sinus rhythm at 75 bpm with a narrow QRS, normal axis, normal intervals, no concerning ST changes.  ____________________________________________   INITIAL IMPRESSION / ASSESSMENT AND PLAN / ED COURSE  Pertinent labs & imaging results that were available during my care of the patient were reviewed by me and considered in my medical decision making (see chart for details).   Patient presents to the emergency department for medical evaluation and weakness.  Patient was outside for many hours in the heat today per staff members per EMS, was complaining of some weakness they were concerned the patient could have become dehydrated or developed heat exhaustion.  Currently the patient appears well denies any symptoms or complaints at this time.  We will check labs, IV hydrate and closely monitor.  Patient agreeable to plan of care.  Patient CK is elevated 420.  Creatinine is currently 3.85 from a baseline closer to 2.0.  Given the significant renal insufficiency with CK elevation patient will be admitted to the hospital service for further treatment and work-up.  Patient agreeable to plan of care.  Seth FasterRobert Harrison was evaluated in Emergency Department on 01/09/2019 for the symptoms described in the history of present illness. He was evaluated in the context of the global COVID-19 pandemic, which necessitated consideration that the patient might be at risk for infection with the SARS-CoV-2 virus that causes COVID-19.  Institutional protocols and algorithms that pertain to the evaluation of patients at risk for COVID-19 are in a state of rapid change based on information released by regulatory bodies including the CDC and federal and state organizations. These policies and algorithms were followed during the patient's care in the ED.  ____________________________________________   FINAL CLINICAL IMPRESSION(S) / ED DIAGNOSES  Weakness Renal insufficiency   Minna AntisPaduchowski, Shamia Uppal, MD 01/09/19 2045

## 2019-01-09 NOTE — H&P (Signed)
[facility] - [place] [facility]    PATIENT NAME: Seth Harrison    MR#:  [id number]  DATE OF BIRTH:  10/15/1940  DATE OF ADMISSION:  01/09/2019  PRIMARY CARE PHYSICIAN: Jalene MulletBryan, Leslie Jane, MD   REQUESTING/REFERRING PHYSICIAN: Dr. Minna AntisKevin Paduchowski  CHIEF COMPLAINT:   Chief Complaint  Patient presents with  . Altered Mental Status    HISTORY OF PRESENT ILLNESS:  Seth Harrison  is a 78 y.o. male with a known history of dementia, depression, GERD, hypertension who presents from a assisted living due to feeling weak, lethargic and having a near syncopal episode and noted to be dehydrated.  Patient given his dementia is a very poor historian therefore most of the history obtained from the chart and from the ER physician.  Patient apparently was at his assisted living and primarily spent the day outside in this heat in the sun and then was noted to be quite lethargic and had a near syncopal episode and therefore sent to the ER for further evaluation.  In the emergency room patient was noted to be dehydrated and in acute on chronic renal failure noted to have mild rhabdomyolysis and therefore hospitalist services were contacted for admission.  Patient denies any fevers, chills, shortness of breath, cough, nausea, vomiting abdominal pain or any other associated symptoms presently.  Patient's COVID-19 test is still pending.  PAST MEDICAL HISTORY:   Past Medical History:  Diagnosis Date  . Dementia (HCC)   . Depression   . GERD (gastroesophageal reflux disease)   . Hypertension     PAST SURGICAL HISTORY:   Past Surgical History:  Procedure Laterality Date  . FACIAL COSMETIC SURGERY      SOCIAL HISTORY:   Social History   Tobacco Use  . Smoking status: Current Every Day Smoker    Packs/day: 1.00    Types: Cigarettes  . Smokeless tobacco: Never Used  Substance Use Topics  . Alcohol use: No    FAMILY HISTORY:   Family History  Problem Relation Age of  Onset  . Hypertension Mother     DRUG ALLERGIES:  No Known Allergies  REVIEW OF SYSTEMS:   Review of Systems  Constitutional: Negative for fever and weight loss.  HENT: Negative for congestion, nosebleeds and tinnitus.   Eyes: Negative for blurred vision, double vision and redness.  Respiratory: Negative for cough, hemoptysis and shortness of breath.   Cardiovascular: Negative for chest pain, orthopnea, leg swelling and PND.  Gastrointestinal: Negative for abdominal pain, diarrhea, melena, nausea and vomiting.  Genitourinary: Negative for dysuria, hematuria and urgency.  Musculoskeletal: Negative for falls and joint pain.  Neurological: Positive for weakness. Negative for dizziness, tingling, sensory change, focal weakness, seizures and headaches.  Endo/Heme/Allergies: Negative for polydipsia. Does not bruise/bleed easily.  Psychiatric/Behavioral: Negative for depression and memory loss. The patient is not nervous/anxious.     MEDICATIONS AT HOME:   Prior to Admission medications   Medication Sig Start Date End Date Taking? Authorizing Provider  acetaminophen (TYLENOL) 650 MG CR tablet Take 650 mg by mouth every 8 (eight) hours as needed for pain.    [provider]  amLODipine (NORVASC) 10 MG tablet Take 1 tablet (10 mg total) daily by mouth. 05/09/17   Milagros LollSudini, Srikar, MD  aspirin 325 MG tablet Take 325 mg by mouth daily.    [provider]  baclofen (LIORESAL) 10 MG tablet Take 10 mg by mouth every 12 (twelve) hours as needed for muscle spasms.  [provider]  cholecalciferol (VITAMIN D) 1000 units tablet Take 1,000 Units by mouth daily.    [provider]  donepezil (ARICEPT) 10 MG tablet Take 10 mg by mouth at bedtime.    [provider]  isosorbide mononitrate (IMDUR) 60 MG 24 hr tablet Take 1 tablet (60 mg total) daily by mouth. 05/08/17   Milagros LollSudini, Srikar, MD  lisinopril (PRINIVIL,ZESTRIL) 40 MG tablet Take 1 tablet (40 mg total)  daily by mouth. 05/08/17   Milagros LollSudini, Srikar, MD  metoprolol tartrate (LOPRESSOR) 25 MG tablet Take 1 tablet (25 mg total) 2 (two) times daily by mouth. 05/08/17   Milagros LollSudini, Srikar, MD  mirtazapine (REMERON) 15 MG tablet Take 15 mg by mouth at bedtime.    [provider]  Multiple Vitamins-Minerals (THEREMS-M) TABS Take 1 tablet by mouth daily.    [provider]  omeprazole (PRILOSEC) 40 MG capsule Take 40 mg by mouth daily.    [provider]  vitamin B-12 (CYANOCOBALAMIN) 1000 MCG tablet Take 1,000 mcg by mouth daily.    [provider]      VITAL SIGNS:  Blood pressure 123/65, pulse (!) 58, temperature 98.3 F (36.8 C), temperature source Oral, resp. rate 19, height 5\' 8"  (1.727 m), weight 95 kg, SpO2 99 %.  PHYSICAL EXAMINATION:  Physical Exam  GENERAL:  78 y.o.-year-old obese patient lying in the bed in no acute distress.  EYES: Pupils equal, round, reactive to light and accommodation. No scleral icterus. Extraocular muscles intact.  HEENT: Head atraumatic, normocephalic. Oropharynx and nasopharynx clear. No oropharyngeal erythema, moist oral mucosa  NECK:  Supple, no jugular venous distention. No thyroid enlargement, no tenderness.  LUNGS: Normal breath sounds bilaterally, no wheezing, rales, rhonchi. No use of accessory muscles of respiration.  CARDIOVASCULAR: S1, S2 RRR. No murmurs, rubs, gallops, clicks.  ABDOMEN: Soft, nontender, nondistended. Bowel sounds present. No organomegaly or mass.  EXTREMITIES: No pedal edema, cyanosis, or clubbing. + 2 pedal & radial pulses b/l.   NEUROLOGIC: Cranial nerves II through XII are intact. No focal Motor or sensory deficits appreciated b/l. Globally weak.  PSYCHIATRIC: The patient is alert and oriented x 1.  SKIN: No obvious rash, lesion, or ulcer.   LABORATORY PANEL:   CBC Recent Labs  Lab 01/09/19 1803  WBC 12.6*  HGB 11.6*  HCT 36.8*  PLT 290    ------------------------------------------------------------------------------------------------------------------  Chemistries  Recent Labs  Lab 01/09/19 1803  NA 140  K 4.6  CL 108  CO2 18*  GLUCOSE 157*  BUN 43*  CREATININE 3.85*  CALCIUM 9.7  AST 29  ALT 24  ALKPHOS 50  BILITOT 0.1*   ------------------------------------------------------------------------------------------------------------------  Cardiac Enzymes No results for input(s): TROPONINI in the last 168 hours. ------------------------------------------------------------------------------------------------------------------  RADIOLOGY:  No results found.   IMPRESSION AND PLAN:   78 y.o. male with a known history of dementia, depression, GERD, hypertension who presents from a assisted living due to feeling weak, lethargic and having a near syncopal episode and noted to be dehydrated.   1.  Acute on chronic renal failure-secondary to heat exhaustion and dehydration. - Baseline creatinine around 1.8-2.2.  Patient now presents with a creatinine of 3.8. - We will hydrate the patient with IV fluids, follow BUN/creatinine and urine output.  Renal dose meds, avoid nephrotoxins.  2.  Rhabdomyolysis-mild, nontraumatic and related to heat exhaustion. - We will hydrate the patient with IV fluids, follow CKs.  3.  Essential hypertension-continue Imdur, metoprolol, Norvasc.  Hold lisinopril given the acute on  chronic renal failure.  4.  Dementia-continue Aricept.  5.  GERD-continue Protonix.   All the records are reviewed and case discussed with ED provider. Management plans discussed with the patient, family and they are in agreement.  CODE STATUS: DNR  TOTAL TIME TAKING CARE OF THIS PATIENT: 40 minutes.    Henreitta Leber M.D on 01/09/2019 at 9:41 PM  Between 7am to 6pm - Pager - [phone]  After 6pm go to [email or web address] - password [email or web address] [facility]  Office  [phone]  CC:  Primary care physician; Will Bonnet, MD

## 2019-01-09 NOTE — ED Notes (Signed)
Pt able to answer all questions appropriately. AOX4.

## 2019-01-09 NOTE — ED Notes (Signed)
ED TO INPATIENT HANDOFF REPORT  ED Nurse Name and Phone #: Shanda Bumpsjessica [phone]  S Name/Age/Gender Vladimir Fasterobert Norberto 78 y.o. male Room/Bed: ED11A/ED11A  Code Status   Code Status: Prior  Home/SNF/Other Boarding Home Patient oriented to: self, place, time and situation Is this baseline? Yes   Triage Complete: Triage complete  Chief Complaint [place] EMS Weakness  Triage Note Pt [facility] (possibly [facility]). Pt was sitting outside for many hours and they called because they thought he might have some heat exhaustion. Pt also seemed a little weak to them. CBG 178. Pt walked for EMS. Pt had x1 episode vomitting. Hx of dementia.     Allergies No Known Allergies  Level of Care/Admitting Diagnosis ED Disposition    ED Disposition Condition Comment   Admit  Hospital Area: [facility] [[id number]]  Level of Care: Med-Surg [16]  Covid Evaluation: Confirmed COVID Negative  Diagnosis: Acute on chronic renal failure [facility](HCC) [[id number]]  Admitting Physician: Houston SirenSAINANI, VIVEK J [[id number]]  Attending Physician: Houston SirenSAINANI, VIVEK J [[id number]]  Estimated length of stay: past midnight tomorrow  Certification:: I certify this patient will need inpatient services for at least 2 midnights  PT Class (Do Not Modify): Inpatient [101]  PT Acc Code (Do Not Modify): Private [1]       B Medical/Surgery History Past Medical History:  Diagnosis Date  . Dementia (HCC)   . Depression   . GERD (gastroesophageal reflux disease)   . Hypertension    Past Surgical History:  Procedure Laterality Date  . FACIAL COSMETIC SURGERY       A IV Location/Drains/Wounds Patient Lines/Drains/Airways Status   Active Line/Drains/Airways    Name:   Placement date:   Placement time:   Site:   Days:   Peripheral IV 01/09/19 Right Hand   01/09/19    1804    Hand   less than 1          Intake/Output Last 24 hours No intake or output data in the 24 hours ending 01/09/19  2238  Labs/Imaging Results for orders placed or performed during the hospital encounter of 01/09/19 (from the past 48 hour(s))  CBC     Status: Abnormal   Collection Time: 01/09/19  6:03 PM  Result Value Ref Range   WBC 12.6 (H) 4.0 - 10.5 K/uL   RBC 4.13 (L) 4.22 - 5.81 MIL/uL   Hemoglobin 11.6 (L) 13.0 - 17.0 g/dL   HCT 25.936.8 (L) 56.339.0 - 87.552.0 %   MCV 89.1 80.0 - 100.0 fL   MCH 28.1 26.0 - 34.0 pg   MCHC 31.5 30.0 - 36.0 g/dL   RDW 64.314.1 32.911.5 - 51.815.5 %   Platelets 290 150 - 400 K/uL   nRBC 0.0 0.0 - 0.2 %    Comment: Performed at [facility], [address] [place], [place] [address]  Comprehensive metabolic panel     Status: Abnormal   Collection Time: 01/09/19  6:03 PM  Result Value Ref Range   Sodium 140 135 - 145 mmol/L   Potassium 4.6 3.5 - 5.1 mmol/L    Comment: HEMOLYSIS AT THIS LEVEL MAY AFFECT RESULT   Chloride 108 98 - 111 mmol/L   CO2 18 (L) 22 - 32 mmol/L   Glucose, Bld 157 (H) 70 - 99 mg/dL   BUN 43 (H) 8 - 23 mg/dL   Creatinine, Ser 0.633.85 (H) 0.61 - 1.24 mg/dL   Calcium 9.7 8.9 - 01.610.3 mg/dL  Total Protein 8.2 (H) 6.5 - 8.1 g/dL   Albumin 4.4 3.5 - 5.0 g/dL   AST 29 15 - 41 U/L   ALT 24 0 - 44 U/L   Alkaline Phosphatase 50 38 - 126 U/L   Total Bilirubin 0.1 (L) 0.3 - 1.2 mg/dL   GFR calc non Af Amer 14 (L) >60 mL/min   GFR calc Af Amer 16 (L) >60 mL/min   Anion gap 14 5 - 15    Comment: Performed at [facility], [address] [place], [place] [address]  CK     Status: Abnormal   Collection Time: 01/09/19  6:03 PM  Result Value Ref Range   Total CK 420 (H) 49 - 397 U/L    Comment: Performed at [facility], [address] [place], [place] [address]  SARS Coronavirus 2 (CEPHEID - Performed in [facility] hospital lab), Hosp Order     Status: None   Collection Time: 01/09/19  8:49 PM   Specimen: Nasopharyngeal Swab  Result Value Ref Range   SARS Coronavirus 2 NEGATIVE NEGATIVE    Comment: (NOTE) If result is  NEGATIVE SARS-CoV-2 target nucleic acids are NOT DETECTED. The SARS-CoV-2 RNA is generally detectable in upper and lower  respiratory specimens during the acute phase of infection. The lowest  concentration of SARS-CoV-2 viral copies this assay can detect is 250  copies / mL. A negative result does not preclude SARS-CoV-2 infection  and should not be used as the sole basis for treatment or other  patient management decisions.  A negative result may occur with  improper specimen collection / handling, submission of specimen other  than nasopharyngeal swab, presence of viral mutation(s) within the  areas targeted by this assay, and inadequate number of viral copies  (<250 copies / mL). A negative result must be combined with clinical  observations, patient history, and epidemiological information. If result is POSITIVE SARS-CoV-2 target nucleic acids are DETECTED. The SARS-CoV-2 RNA is generally detectable in upper and lower  respiratory specimens dur ing the acute phase of infection.  Positive  results are indicative of active infection with SARS-CoV-2.  Clinical  correlation with patient history and other diagnostic information is  necessary to determine patient infection status.  Positive results do  not rule out bacterial infection or co-infection with other viruses. If result is PRESUMPTIVE POSTIVE SARS-CoV-2 nucleic acids MAY BE PRESENT.   A presumptive positive result was obtained on the submitted specimen  and confirmed on repeat testing.  While 2019 novel coronavirus  (SARS-CoV-2) nucleic acids may be present in the submitted sample  additional confirmatory testing may be necessary for epidemiological  and / or clinical management purposes  to differentiate between  SARS-CoV-2 and other Sarbecovirus currently known to infect humans.  If clinically indicated additional testing with an alternate test  methodology [phone] is advised. The SARS-CoV-2 RNA is generally  detectable  in upper and lower respiratory sp ecimens during the acute  phase of infection. The expected result is Negative. Fact Sheet for Patients:  BoilerBrush.com.cyhttps://www.fda.gov/media/136312/download Fact Sheet for Healthcare Providers: https://pope.com/https://www.fda.gov/media/136313/download This test is not yet approved or cleared by the [place] FDA and has been authorized for detection and/or diagnosis of SARS-CoV-2 by FDA under an Emergency Use Authorization (EUA).  This EUA will remain in effect (meaning this test can be used) for the duration of the COVID-19 declaration under Section 564(b)(1) of the Act, 21 U.S.C. section 360bbb-3(b)(1), unless the authorization is terminated or revoked sooner.  Performed at [facility], [address] [place], [place] [address]    No results found.  Pending Labs [employer] (From admission, onward)    Start     Ordered   01/10/19 0500  CK  Tomorrow morning,   STAT     01/09/19 2144   Signed and Held  Basic metabolic panel  Tomorrow morning,   R     Signed and Held   Signed and Held  CBC  Tomorrow morning,   R     Signed and Held   Signed and Held  CBC  (heparin)  Once,   R    Comments: Baseline for heparin therapy IF NOT ALREADY DRAWN.  Notify MD if PLT < 100 K.    Signed and Held   Signed and Held  Creatinine, serum  (heparin)  Once,   R    Comments: Baseline for heparin therapy IF NOT ALREADY DRAWN.    Signed and Held          Vitals/Pain Today's Vitals   01/09/19 2030 01/09/19 2100 01/09/19 2130 01/09/19 2200  BP: 123/65 124/67 134/70 (!) 153/74  Pulse: (!) 58 (!) 55    Resp: 19  18 16   Temp:      TempSrc:      SpO2: 99% 96% 100%   Weight:      Height:      PainSc:        Isolation Precautions No active isolations  Medications Medications  sodium chloride 0.9 % bolus 1,000 mL (0 mLs Intravenous Stopped 01/09/19 2008)  ondansetron (ZOFRAN) injection 4 mg (4 mg Intravenous Given 01/09/19 1852)    Mobility walks with  device Low fall risk   Focused Assessments Neuro Assessment Handoff:   Cardiac Rhythm: Normal sinus rhythm   Last date known well: 01/09/19 Last time known well: 1530 Neuro Assessment: Exceptions to WDL(group home was concerned about weakness and heat exhaustion)     R Recommendations: See Admitting Provider Note  Report given to:   Additional Notes:

## 2019-01-09 NOTE — ED Notes (Signed)
Pt resting in bed. Blanket and water given. Pt has no complaints at this time. Group home contact notified.

## 2019-01-10 LAB — CBC
HCT: 36 % — ABNORMAL LOW (ref 39.0–52.0)
Hemoglobin: 11.1 g/dL — ABNORMAL LOW (ref 13.0–17.0)
MCH: 28 pg (ref 26.0–34.0)
MCHC: 30.8 g/dL (ref 30.0–36.0)
MCV: 90.9 fL (ref 80.0–100.0)
Platelets: 242 10*3/uL (ref 150–400)
RBC: 3.96 MIL/uL — ABNORMAL LOW (ref 4.22–5.81)
RDW: 13.9 % (ref 11.5–15.5)
WBC: 9.3 10*3/uL (ref 4.0–10.5)
nRBC: 0 % (ref 0.0–0.2)

## 2019-01-10 LAB — BASIC METABOLIC PANEL
Anion gap: 9 (ref 5–15)
BUN: 40 mg/dL — ABNORMAL HIGH (ref 8–23)
CO2: 23 mmol/L (ref 22–32)
Calcium: 8.9 mg/dL (ref 8.9–10.3)
Chloride: 109 mmol/L (ref 98–111)
Creatinine, Ser: 3.25 mg/dL — ABNORMAL HIGH (ref 0.61–1.24)
GFR calc Af Amer: 20 mL/min — ABNORMAL LOW (ref >60)
GFR calc non Af Amer: 17 mL/min — ABNORMAL LOW (ref >60)
Glucose, Bld: 110 mg/dL — ABNORMAL HIGH (ref 70–99)
Potassium: 4.4 mmol/L (ref 3.5–5.1)
Sodium: 141 mmol/L (ref 135–145)

## 2019-01-10 LAB — CK: Total CK: 450 U/L — ABNORMAL HIGH (ref 49–397)

## 2019-01-10 LAB — MRSA PCR SCREENING: MRSA by PCR: NEGATIVE

## 2019-01-10 MED ORDER — HEPARIN SODIUM (PORCINE) 5000 UNIT/ML IJ SOLN
5000.0000 [IU] | Freq: Three times a day (TID) | INTRAMUSCULAR | Status: DC
  Administered 2019-01-10 – 2019-01-11 (×4): 5000 [IU] via SUBCUTANEOUS
  Filled 2019-01-10 (×4): qty 1

## 2019-01-10 MED ORDER — ACETAMINOPHEN 325 MG PO TABS: 650.0000 mg | ORAL_TABLET | Freq: Four times a day (QID) | ORAL | Status: DC | PRN

## 2019-01-10 MED ORDER — ASPIRIN EC 325 MG PO TBEC
325.0000 mg | DELAYED_RELEASE_TABLET | Freq: Every day | ORAL | Status: DC
  Administered 2019-01-10 – 2019-01-11 (×2): 325 mg via ORAL
  Filled 2019-01-10 (×2): qty 1

## 2019-01-10 MED ORDER — VITAMIN B-12 1000 MCG PO TABS
1000.0000 ug | ORAL_TABLET | Freq: Every day | ORAL | Status: DC
  Administered 2019-01-10 – 2019-01-11 (×2): 1000 ug via ORAL
  Filled 2019-01-10 (×2): qty 1

## 2019-01-10 MED ORDER — AMLODIPINE BESYLATE 10 MG PO TABS
10.0000 mg | ORAL_TABLET | Freq: Every day | ORAL | Status: DC
  Administered 2019-01-10 – 2019-01-11 (×2): 10 mg via ORAL
  Filled 2019-01-10 (×2): qty 1

## 2019-01-10 MED ORDER — BACLOFEN 10 MG PO TABS
10.0000 mg | ORAL_TABLET | Freq: Two times a day (BID) | ORAL | Status: DC | PRN
  Filled 2019-01-10: qty 1

## 2019-01-10 MED ORDER — ISOSORBIDE MONONITRATE ER 30 MG PO TB24
60.0000 mg | ORAL_TABLET | Freq: Every day | ORAL | Status: DC
  Administered 2019-01-10 – 2019-01-11 (×2): 60 mg via ORAL
  Filled 2019-01-10 (×2): qty 2

## 2019-01-10 MED ORDER — ONDANSETRON HCL 4 MG PO TABS: 4.0000 mg | ORAL_TABLET | Freq: Four times a day (QID) | ORAL | Status: DC | PRN

## 2019-01-10 MED ORDER — SODIUM CHLORIDE 0.9 % IV SOLN
INTRAVENOUS | Status: DC
  Administered 2019-01-10 – 2019-01-11 (×3): via INTRAVENOUS

## 2019-01-10 MED ORDER — ACETAMINOPHEN 650 MG RE SUPP: 650.0000 mg | Freq: Four times a day (QID) | RECTAL | Status: DC | PRN

## 2019-01-10 MED ORDER — METOPROLOL TARTRATE 25 MG PO TABS
25.0000 mg | ORAL_TABLET | Freq: Two times a day (BID) | ORAL | Status: DC
  Administered 2019-01-10 (×2): 25 mg via ORAL
  Filled 2019-01-10 (×2): qty 1

## 2019-01-10 MED ORDER — ONDANSETRON HCL 4 MG/2ML IJ SOLN: 4.0000 mg | Freq: Four times a day (QID) | INTRAMUSCULAR | Status: DC | PRN

## 2019-01-10 MED ORDER — PANTOPRAZOLE SODIUM 40 MG PO TBEC
40.0000 mg | DELAYED_RELEASE_TABLET | Freq: Every day | ORAL | Status: DC
  Administered 2019-01-10 – 2019-01-11 (×2): 40 mg via ORAL
  Filled 2019-01-10 (×2): qty 1

## 2019-01-10 MED ORDER — DONEPEZIL HCL 5 MG PO TABS
10.0000 mg | ORAL_TABLET | Freq: Every day | ORAL | Status: DC
  Administered 2019-01-10: 10 mg via ORAL
  Filled 2019-01-10: qty 2

## 2019-01-10 MED ORDER — MIRTAZAPINE 15 MG PO TABS
15.0000 mg | ORAL_TABLET | Freq: Every day | ORAL | Status: DC
  Administered 2019-01-10: 15 mg via ORAL
  Filled 2019-01-10: qty 1

## 2019-01-10 MED ORDER — ENSURE ENLIVE PO LIQD
237.0000 mL | Freq: Two times a day (BID) | ORAL | Status: DC
  Administered 2019-01-10 – 2019-01-11 (×4): 237 mL via ORAL

## 2019-01-10 MED ORDER — VITAMIN D 25 MCG (1000 UNIT) PO TABS
1000.0000 [IU] | ORAL_TABLET | Freq: Every day | ORAL | Status: DC
  Administered 2019-01-10 – 2019-01-11 (×2): 1000 [IU] via ORAL
  Filled 2019-01-10 (×2): qty 1

## 2019-01-10 MED ORDER — ADULT MULTIVITAMIN W/MINERALS CH
1.0000 | ORAL_TABLET | Freq: Every day | ORAL | Status: DC
  Administered 2019-01-10 – 2019-01-11 (×2): 1 via ORAL
  Filled 2019-01-10 (×2): qty 1

## 2019-01-10 NOTE — Progress Notes (Signed)
Initial Nutrition Assessment  DOCUMENTATION CODES:   Obesity unspecified  INTERVENTION:   Ensure Enlive po BID, each supplement provides 350 kcal and 20 grams of protein  MVI daily   Liberalize diet   NUTRITION DIAGNOSIS:   Inadequate oral intake related to acute illness as evidenced by other (comment)(per chart review)  GOAL:   Patient will meet greater than or equal to 90% of their needs  MONITOR:   PO intake, Supplement acceptance, Labs, Weight trends, Skin, I & O's  REASON FOR ASSESSMENT:   Malnutrition Screening Tool    ASSESSMENT:   78 y.o. male with a known history of dementia, depression, GERD, hypertension who presents from a assisted living due to feeling weak, lethargic and having a near syncopal episode and noted to be dehydrated.  RD working remotely.  Pt with poor appetite and oral intake r/t acute illness. Suspect pt with good appetite and oral intake at baseline. RD will add supplements and liberalize pt's diet to help him meet his estimated needs. Per chart, pt is weight stable pta.   Medications reviewed and include: aspirin, D3, heparin, mirtazapine, MVI, protonix, B12, NaCl @125ml /hr   Labs reviewed: K 4.4 wnl, BUN 40(H), creat 3.25(H)  Unable to complete Nutrition-Focused physical exam at this time.   Diet Order:   Diet Order            Diet Heart Room service appropriate? Yes; Fluid consistency: Thin  Diet effective now              EDUCATION NEEDS:   No education needs have been identified at this time  Skin:  Skin Assessment: Reviewed RN Assessment  Last BM:  7/11  Height:   Ht Readings from Last 1 Encounters:  01/09/19 5\' 8"  (1.727 m)    Weight:   Wt Readings from Last 1 Encounters:  01/09/19 95 kg    Ideal Body Weight:  70 kg  BMI:  Body mass index is 31.84 kg/m.  Estimated Nutritional Needs:   Kcal:  2000-2300kcal/day  Protein:  95-105g/day  Fluid:  >1.8L/day  Koleen Distance MS, RD, LDN Pager #-  [phone] Office#- [phone] After Hours Pager: [phone]

## 2019-01-10 NOTE — Plan of Care (Signed)

## 2019-01-10 NOTE — Progress Notes (Signed)
Seth Harrison at Seth Harrison NAME: Seth Harrison    MR#:  [id number]  DATE OF BIRTH:  08/23/1940  SUBJECTIVE:  CHIEF COMPLAINT:   Chief Complaint  Patient presents with  . Altered Mental Status   Brought with altered mental status.  Still slightly confused cannot give reliable review of system. REVIEW OF SYSTEMS:  Cannot give review of systems due to confusion. ROS  DRUG ALLERGIES:  No Known Allergies  VITALS:  Blood pressure (!) 149/76, pulse (!) 55, temperature 98.2 F (36.8 C), temperature source Oral, resp. rate 20, height 5\' 8"  (1.727 m), weight 95 kg, SpO2 99 %.  PHYSICAL EXAMINATION:  GENERAL:  78 y.o.-year-old patient lying in the bed with no acute distress.  EYES: Pupils equal, round, reactive to light and accommodation. No scleral icterus. Extraocular muscles intact.  HEENT: Head atraumatic, normocephalic. Oropharynx and nasopharynx clear.  NECK:  Supple, no jugular venous distention. No thyroid enlargement, no tenderness.  LUNGS: Normal breath sounds bilaterally, no wheezing, rales,rhonchi or crepitation. No use of accessory muscles of respiration.  CARDIOVASCULAR: S1, S2 normal. No murmurs, rubs, or gallops.  ABDOMEN: Soft, nontender, nondistended. Bowel sounds present. No organomegaly or mass.  EXTREMITIES: No pedal edema, cyanosis, or clubbing.  NEUROLOGIC: Cranial nerves II through XII are intact. Muscle strength 4/5 in all extremities. Sensation intact. Gait not checked.  PSYCHIATRIC: The patient is alert and oriented x 2.  SKIN: No obvious rash, lesion, or ulcer.   Physical Exam LABORATORY PANEL:   CBC Recent Labs  Lab 01/10/19 0556  WBC 9.3  HGB 11.1*  HCT 36.0*  PLT 242   ------------------------------------------------------------------------------------------------------------------  Chemistries  Recent Labs  Lab 01/09/19 1803 01/10/19 0556  NA 140 141  K 4.6 4.4  CL 108 109  CO2 18* 23  GLUCOSE 157*  110*  BUN 43* 40*  CREATININE 3.85* 3.25*  CALCIUM 9.7 8.9  AST 29  --   ALT 24  --   ALKPHOS 50  --   BILITOT 0.1*  --    ------------------------------------------------------------------------------------------------------------------  Cardiac Enzymes No results for input(s): TROPONINI in the last 168 hours. ------------------------------------------------------------------------------------------------------------------  RADIOLOGY:  No results found.  ASSESSMENT AND PLAN:   Active Problems:   Acute on chronic renal failure ([place])   78 y.o. male with a known history of dementia, depression, GERD, hypertension who presents from a assisted living due to feeling weak, lethargic and having a near syncopal episode and noted to be dehydrated.   1.  Acute on chronic renal failure-secondary to heat exhaustion and dehydration. - Baseline creatinine around 1.8-2.2.  Patient now presents with a creatinine of 3.8. - hydrate the patient with IV fluids, follow BUN/creatinine and urine output.  Renal dose meds, avoid nephrotoxins. Renal function slightly improved  2.  Rhabdomyolysis-mild, nontraumatic and related to heat exhaustion. - hydrate the patient with IV fluids, follow CKs.  3.  Essential hypertension-continue Imdur, metoprolol, Norvasc.  Hold lisinopril given the acute on chronic renal failure.  4.  Dementia-continue Aricept.  5.  GERD-continue Protonix.    All the records are reviewed and case discussed with Care Management/Social Workerr. Management plans discussed with the patient, family and they are in agreement.  CODE STATUS: DNR  TOTAL TIME TAKING CARE OF THIS PATIENT: 35 minutes.     POSSIBLE D/C IN 1-2 DAYS, DEPENDING ON CLINICAL CONDITION.   Vaughan Basta M.D on 01/10/2019   Between 7am to 6pm - Pager - [phone]  After 6pm go to  [email or web address] - password [email or web address]  [facility] [place] [facility]  Office  [phone]  CC: Primary care  physician; Jalene MulletBryan, Leslie Jane, MD  Note: This dictation was prepared with Dragon dictation along with smaller phrase technology. Any transcriptional errors that result from this process are unintentional.

## 2019-01-11 LAB — BASIC METABOLIC PANEL
Anion gap: 8 (ref 5–15)
BUN: 35 mg/dL — ABNORMAL HIGH (ref 8–23)
CO2: 23 mmol/L (ref 22–32)
Calcium: 8.8 mg/dL — ABNORMAL LOW (ref 8.9–10.3)
Chloride: 111 mmol/L (ref 98–111)
Creatinine, Ser: 2.32 mg/dL — ABNORMAL HIGH (ref 0.61–1.24)
GFR calc Af Amer: 30 mL/min — ABNORMAL LOW (ref >60)
GFR calc non Af Amer: 26 mL/min — ABNORMAL LOW (ref >60)
Glucose, Bld: 96 mg/dL (ref 70–99)
Potassium: 4.1 mmol/L (ref 3.5–5.1)
Sodium: 142 mmol/L (ref 135–145)

## 2019-01-11 LAB — CK: Total CK: 483 U/L — ABNORMAL HIGH (ref 49–397)

## 2019-01-11 MED ORDER — SODIUM BICARBONATE 8.4 % IV SOLN
INTRAVENOUS | Status: DC
  Administered 2019-01-11: 12:00:00 via INTRAVENOUS
  Filled 2019-01-11 (×2): qty 100

## 2019-01-11 MED ORDER — ENSURE ENLIVE PO LIQD: 237.0000 mL | Freq: Two times a day (BID) | ORAL | 12 refills | Status: AC

## 2019-01-11 MED ORDER — HYDRALAZINE HCL 10 MG PO TABS
10.0000 mg | ORAL_TABLET | Freq: Three times a day (TID) | ORAL | Status: DC
  Administered 2019-01-11: 10 mg via ORAL
  Filled 2019-01-11 (×2): qty 1

## 2019-01-11 MED ORDER — HYDRALAZINE HCL 10 MG PO TABS: 10.0000 mg | ORAL_TABLET | Freq: Three times a day (TID) | ORAL | 0 refills | Status: AC

## 2019-01-11 NOTE — Evaluation (Signed)
Physical Therapy Evaluation Patient Details Name: Seth Harrison Feely MRN: [id number] DOB: 04/11/1941 Today's Date: 01/11/2019   History of Present Illness  presented to ER secondary to AMS, near syncopal episode; admitted for management of acute/chronic renal failure (due to heat, dehydration), mild rhabdomyolysis.  Clinical Impression  Upon evaluation, patient alert and oriented to basic information; follows commands and demonstrates good effort with mobility tasks.  Bilat UE/LE strength and ROM grossly symmetrical and WFL; no focal weakness, sensory or coordination deficit noted.  Able to complete bed mobility with sup; sit/stand, basic transfers and gait (40') with RW, cga/min assist.  Demonstrates very forward flexed posture, RW arms length anterior to patient; min cuing for body position within RW (esp with turns).  Slightly impuslive, but no overt buckling or LOB.  Recommend continued use of RW and +1 sup/assist as needed at this time. Vitals stable and WFL throughout session and with position change; asymptomatic with no prodromal symptoms reported.  May consider use of brief for longer gait distances (appears to have difficulty with bladder control). Would benefit from skilled PT to address above deficits and promote optimal return to PLOF; Recommend transition to HHPT upon discharge from acute hospitalization.      Follow Up Recommendations Home health PT    Equipment Recommendations       Recommendations for Other Services       Precautions / Restrictions Precautions Precautions: Fall Restrictions Weight Bearing Restrictions: No      Mobility  Bed Mobility Overal bed mobility: Needs Assistance Bed Mobility: Supine to Sit     Supine to sit: Supervision     General bed mobility comments: heavy use of momentum to complete  Transfers Overall transfer level: Needs assistance Equipment used: Rolling walker (2 wheeled) Transfers: Sit to/from Stand Sit to Stand: Min assist          General transfer comment: multiple attempts, use of UEs and momentum required for lift off  Ambulation/Gait Ambulation/Gait assistance: Min assist Gait Distance (Feet): 40 Feet Assistive device: Rolling walker (2 wheeled)       General Gait Details: very forward flexed posture, RW arms length anterior to patient; min cuing for body position within RW (esp with turns).  Slightly impuslive, but no overt buckling or LOB  Stairs            Wheelchair Mobility    Modified Rankin (Stroke Patients Only)       Balance Overall balance assessment: Needs assistance Sitting-balance support: No upper extremity supported;Feet supported Sitting balance-Leahy Scale: Good     Standing balance support: Bilateral upper extremity supported Standing balance-Leahy Scale: Fair                               Pertinent Vitals/Pain Pain Assessment: No/denies pain    Home Living Family/patient expects to be discharged to:: Private residence Living Arrangements: Group Home   Type of Home: Group Home Home Access: Level entry     Home Layout: One level Home Equipment: Walker - 2 wheels;Cane - single point      Prior Function Level of Independence: Independent with assistive device(s)         Comments: Mod indep with 4WRW for household distances; staff assists with ADLs, household chores, meals and meds as needed     Hand Dominance        Extremity/Trunk Assessment   Upper Extremity Assessment Upper Extremity Assessment: Overall WFL for tasks assessed  Lower Extremity Assessment Lower Extremity Assessment: Overall WFL for tasks assessed(grossly at least 4/5 throughout)       Communication   Communication: No difficulties  Cognition Arousal/Alertness: Awake/alert Behavior During Therapy: WFL for tasks assessed/performed Overall Cognitive Status: Within Functional Limits for tasks assessed                                         General Comments      Exercises Other Exercises Other Exercises: Orthostatic assessment-see vitals flowsheet; vitals stable and WFL, asymptomatic.   Assessment/Plan    PT Assessment Patient needs continued PT services  PT Problem List Decreased strength;Decreased activity tolerance;Decreased balance;Decreased mobility;Decreased cognition;Decreased knowledge of use of DME;Decreased safety awareness;Decreased knowledge of precautions       PT Treatment Interventions DME instruction;Gait training;Functional mobility training;Therapeutic activities;Balance training;Therapeutic exercise;Cognitive remediation;Patient/family education    PT Goals (Current goals can be found in the Care Plan section)  Acute Rehab PT Goals Patient Stated Goal: to get my breakfast PT Goal Formulation: With patient Time For Goal Achievement: 01/25/19 Potential to Achieve Goals: Good    Frequency Min 2X/week   Barriers to discharge        Co-evaluation               AM-PAC PT "6 Clicks" Mobility  Outcome Measure Help needed turning from your back to your side while in a flat bed without using bedrails?: None Help needed moving from lying on your back to sitting on the side of a flat bed without using bedrails?: None Help needed moving to and from a bed to a chair (including a wheelchair)?: None Help needed standing up from a chair using your arms (e.g., wheelchair or bedside chair)?: A Little Help needed to walk in hospital room?: A Little Help needed climbing 3-5 steps with a railing? : A Little 6 Click Score: 21    End of Session Equipment Utilized During Treatment: Gait belt Activity Tolerance: Patient tolerated treatment well Patient left: in chair;with call bell/phone within reach;with chair alarm set Nurse Communication: Mobility status PT Visit Diagnosis: Muscle weakness (generalized) (M62.81);Difficulty in walking, not elsewhere classified (R26.2)    Time: 2035-5974 PT Time  Calculation (min) (ACUTE ONLY): 26 min   Charges:   PT Evaluation $PT Eval Moderate Complexity: 1 Mod PT Treatments $Therapeutic Activity: 8-22 mins       Ethyn Schetter H. Owens Shark, PT, DPT, NCS 01/11/19, 8:59 AM [phone]

## 2019-01-11 NOTE — NC FL2 (Signed)
[place] LEVEL OF CARE SCREENING TOOL     IDENTIFICATION  Patient Name: Seth Harrison Birthdate: [birth year] Sex: male Admission Date (Current Location): 01/09/2019  [place] and [place] Number:  Engineering geologist and Address:  [facility], [address] [place], [place] [address]      Provider Number: [id number]  Attending Physician Name and Address:  Vaughan Basta, *  Relative Name and Phone Number:  Cherly Anderson [phone]    Current Level of Care: Hospital Recommended Level of Care: Other (Comment)(Group Home) Prior Approval Number:    Date Approved/Denied:   PASRR Number:    Discharge Plan: Other (Comment)([facility])    Current Diagnoses: Patient Active Problem List   Diagnosis Date Noted  . Acute on chronic renal failure ([place]) 01/09/2019  . SVT (supraventricular tachycardia) ([place]) 05/02/2017    Orientation RESPIRATION BLADDER Height & Weight     Self, Place  Normal Continent Weight: 95 kg Height:  5\' 8"  (172.7 cm)  BEHAVIORAL SYMPTOMS/MOOD NEUROLOGICAL BOWEL NUTRITION STATUS      Continent Diet  AMBULATORY STATUS COMMUNICATION OF NEEDS Skin   Limited Assist Verbally Normal                       Personal Care Assistance Level of Assistance  Bathing, Dressing Bathing Assistance: Limited assistance   Dressing Assistance: Limited assistance     Functional Limitations Info  Sight, Hearing, Speech Sight Info: Adequate Hearing Info: Adequate Speech Info: Adequate    SPECIAL CARE FACTORS FREQUENCY                       Contractures Contractures Info: Not present    Additional Factors Info  Code Status, Allergies Code Status Info: DNR Allergies Info: KNDA           Current Medications (01/11/2019):  This is the current hospital active medication list Current Facility-Administered Medications  Medication Dose Route Frequency Provider Last Rate Last Dose  .  acetaminophen (TYLENOL) tablet 650 mg  650 mg Oral Q6H PRN Henreitta Leber, MD       Or  . acetaminophen (TYLENOL) suppository 650 mg  650 mg Rectal Q6H PRN Henreitta Leber, MD      . amLODipine (NORVASC) tablet 10 mg  10 mg Oral Daily Henreitta Leber, MD   10 mg at 01/11/19 0949  . aspirin EC tablet 325 mg  325 mg Oral Daily Henreitta Leber, MD   325 mg at 01/11/19 0949  . baclofen (LIORESAL) tablet 10 mg  10 mg Oral Q12H PRN Henreitta Leber, MD      . cholecalciferol (VITAMIN D3) tablet 1,000 Units  1,000 Units Oral Daily Henreitta Leber, MD   1,000 Units at 01/11/19 0949  . donepezil (ARICEPT) tablet 10 mg  10 mg Oral QHS Henreitta Leber, MD   10 mg at 01/10/19 2223  . feeding supplement (ENSURE ENLIVE) (ENSURE ENLIVE) liquid 237 mL  237 mL Oral BID BM Vaughan Basta, MD   237 mL at 01/11/19 0950  . heparin injection 5,000 Units  5,000 Units Subcutaneous Q8H Henreitta Leber, MD   5,000 Units at 01/11/19 0549  . hydrALAZINE (APRESOLINE) tablet 10 mg  10 mg Oral Q8H Vaughan Basta, MD      . isosorbide mononitrate (IMDUR) 24 hr tablet 60 mg  60 mg Oral Daily Sainani, Belia Heman, MD   60 mg  at 01/11/19 0949  . mirtazapine (REMERON) tablet 15 mg  15 mg Oral QHS Houston SirenSainani, Vivek J, MD   15 mg at 01/10/19 2225  . multivitamin with minerals tablet 1 tablet  1 tablet Oral Daily [place] SirenSainani, Vivek J, MD   1 tablet at 01/11/19 0949  . ondansetron (ZOFRAN) tablet 4 mg  4 mg Oral Q6H PRN [place] SirenSainani, Vivek J, MD       Or  . ondansetron (ZOFRAN) injection 4 mg  4 mg Intravenous Q6H PRN [place] SirenSainani, Vivek J, MD      . pantoprazole (PROTONIX) EC tablet 40 mg  40 mg Oral Daily [place] SirenSainani, Vivek J, MD   40 mg at 01/11/19 0949  . sodium bicarbonate 100 mEq in dextrose 5 % 1,000 mL infusion   Intravenous Continuous Altamese DillingVachhani, Vaibhavkumar, MD      . vitamin B-12 (CYANOCOBALAMIN) tablet 1,000 mcg  1,000 mcg Oral Daily [place] SirenSainani, Vivek J, MD   1,000 mcg at 01/11/19 [id number]     Discharge Medications: Please see  discharge summary for a list of discharge medications.  Relevant Imaging Results:  Relevant Lab Results:   Additional Information [id number]  Barrie Dunkereliliah J Deanndra Kirley, RN

## 2019-01-11 NOTE — Plan of Care (Signed)

## 2019-01-11 NOTE — Discharge Summary (Signed)
[facility] - [place] [facility]   PATIENT NAME: Seth FasterRobert Harrison    MR#:  [id number]  DATE OF BIRTH:  11/02/1940  DATE OF ADMISSION:  01/09/2019 ADMITTING PHYSICIAN: [place] SirenVivek J Sainani, MD  DATE OF DISCHARGE: 01/11/2019   PRIMARY CARE PHYSICIAN: Jalene MulletBryan, Leslie Jane, MD    ADMISSION DIAGNOSIS:  Weakness [R53.1] Renal insufficiency [N28.9] Acute on chronic renal failure (HCC) [N17.9, N18.9]  DISCHARGE DIAGNOSIS:  Active Problems:   Acute on chronic renal failure (HCC)   SECONDARY DIAGNOSIS:   Past Medical History:  Diagnosis Date  . Dementia (HCC)   . Depression   . GERD (gastroesophageal reflux disease)   . Hypertension     HOSPITAL COURSE:   78 y.o.malewith a known history of dementia, depression, GERD, hypertension who presents from a assisted living due to feeling weak, lethargic and having a near syncopal episode and noted to be dehydrated.   1. Acute on chronic renal failure-secondary to heat exhaustion and dehydration. -Baseline creatinine around 1.8-2.2. Patient now presents with a creatinine of 3.8. -hydrate the patient with IV fluids, follow BUN/creatinine and urine output. Renal dose meds, avoid nephrotoxins. Renal function slightly improved-creatinine came down to 2.32 -We will discharge back to his facility and advised to check renal function in next 1 week with primary care physician.  2. Rhabdomyolysis-mild, nontraumatic and related to heat exhaustion. - hydrate the patient with IV fluids, follow CKs. -On discharge we are advising to follow CK level with primary care physician in 1 week.  3. Essential hypertension-continue Imdur, metoprolol, Norvasc. Hold lisinopril given the acute on chronic renal failure. To help with the blood pressure control we have added hydralazine oral instead of lisinopril now.  4. Dementia-continue Aricept.  5. GERD-continue Protonix.  DISCHARGE CONDITIONS:   Stable.  CONSULTS  OBTAINED:  Treatment Team:  Altamese DillingVachhani, Greogry Goodwyn, MD  DRUG ALLERGIES:  No Known Allergies  DISCHARGE MEDICATIONS:   Allergies as of 01/11/2019   No Known Allergies     Medication List    STOP taking these medications   lisinopril 40 MG tablet Commonly known as: ZESTRIL   metoprolol tartrate 25 MG tablet Commonly known as: LOPRESSOR     TAKE these medications   acetaminophen 650 MG CR tablet Commonly known as: TYLENOL Take 650 mg by mouth every 8 (eight) hours as needed for pain.   amLODipine 10 MG tablet Commonly known as: NORVASC Take 1 tablet (10 mg total) daily by mouth.   aspirin 325 MG tablet Take 325 mg by mouth daily.   cholecalciferol 1000 units tablet Commonly known as: VITAMIN D Take 1,000 Units by mouth daily.   donepezil 10 MG tablet Commonly known as: ARICEPT Take 10 mg by mouth at bedtime.   feeding supplement (ENSURE ENLIVE) Liqd Take 237 mLs by mouth 2 (two) times daily between meals.   hydrALAZINE 10 MG tablet Commonly known as: APRESOLINE Take 1 tablet (10 mg total) by mouth every 8 (eight) hours.   isosorbide mononitrate 60 MG 24 hr tablet Commonly known as: IMDUR Take 1 tablet (60 mg total) daily by mouth.   mirtazapine 15 MG tablet Commonly known as: REMERON Take 15 mg by mouth at bedtime.   omeprazole 40 MG capsule Commonly known as: PRILOSEC Take 40 mg by mouth daily.   Therems-M Tabs Take 1 tablet by mouth daily.   vitamin B-12 1000 MCG tablet Commonly known as: CYANOCOBALAMIN Take 1,000 mcg by mouth daily.        DISCHARGE INSTRUCTIONS:  Follow with PMD to check renal func and CK in 1 week.  If you experience worsening of your admission symptoms, develop shortness of breath, life threatening emergency, suicidal or homicidal thoughts you must seek medical attention immediately by calling 911 or calling your MD immediately  if symptoms less severe.  You Must read complete instructions/literature along with all  the possible adverse reactions/side effects for all the Medicines you take and that have been prescribed to you. Take any new Medicines after you have completely understood and accept all the possible adverse reactions/side effects.   Please note  You were cared for by a hospitalist during your hospital stay. If you have any questions about your discharge medications or the care you received while you were in the hospital after you are discharged, you can call the unit and asked to speak with the hospitalist on call if the hospitalist that took care of you is not available. Once you are discharged, your primary care physician will handle any further medical issues. Please note that NO REFILLS for any discharge medications will be authorized once you are discharged, as it is imperative that you return to your primary care physician (or establish a relationship with a primary care physician if you do not have one) for your aftercare needs so that they can reassess your need for medications and monitor your lab values.    Today   CHIEF COMPLAINT:   Chief Complaint  Patient presents with  . Altered Mental Status    HISTORY OF PRESENT ILLNESS:  Seth Harrison  is a 78 y.o. male with a known history of  known history of dementia, depression, GERD, hypertension who presents from a assisted living due to feeling weak, lethargic and having a near syncopal episode and noted to be dehydrated.  Patient given his dementia is a very poor historian therefore most of the history obtained from the chart and from the ER physician.  Patient apparently was at his assisted living and primarily spent the day outside in this heat in the sun and then was noted to be quite lethargic and had a near syncopal episode and therefore sent to the ER for further evaluation.  In the emergency room patient was noted to be dehydrated and in acute on chronic renal failure noted to have mild rhabdomyolysis and therefore hospitalist  services were contacted for admission.  Patient denies any fevers, chills, shortness of breath, cough, nausea, vomiting abdominal pain or any other associated symptoms presently.  Patient's COVID-19 test is still pending.    VITAL SIGNS:  Blood pressure (!) 159/80, pulse (!) 54, temperature 97.8 F (36.6 C), temperature source Oral, resp. rate 18, height 5\' 8"  (1.727 m), weight 95 kg, SpO2 100 %.  I/O:    Intake/Output Summary (Last 24 hours) at 01/11/2019 1406 Last data filed at 01/11/2019 0930 Gross per 24 hour  Intake 2281.83 ml  Output 1350 ml  Net 931.83 ml    PHYSICAL EXAMINATION:   GENERAL:  78 y.o.-year-old patient lying in the bed with no acute distress.  EYES: Pupils equal, round, reactive to light and accommodation. No scleral icterus. Extraocular muscles intact.  HEENT: Head atraumatic, normocephalic. Oropharynx and nasopharynx clear.  NECK:  Supple, no jugular venous distention. No thyroid enlargement, no tenderness.  LUNGS: Normal breath sounds bilaterally, no wheezing, rales,rhonchi or crepitation. No use of accessory muscles of respiration.  CARDIOVASCULAR: S1, S2 normal. No murmurs, rubs, or gallops.  ABDOMEN: Soft, nontender, nondistended. Bowel sounds present. No organomegaly  or mass.  EXTREMITIES: No pedal edema, cyanosis, or clubbing.  NEUROLOGIC: Cranial nerves II through XII are intact. Muscle strength 4/5 in all extremities. Sensation intact. Gait not checked.  PSYCHIATRIC: The patient is alert and oriented x 1.  SKIN: No obvious rash, lesion, or ulcer.   DATA REVIEW:   CBC Recent Labs  Lab 01/10/19 0556  WBC 9.3  HGB 11.1*  HCT 36.0*  PLT 242    Chemistries  Recent Labs  Lab 01/09/19 1803  01/11/19 0600  NA 140   < > 142  K 4.6   < > 4.1  CL 108   < > 111  CO2 18*   < > 23  GLUCOSE 157*   < > 96  BUN 43*   < > 35*  CREATININE 3.85*   < > 2.32*  CALCIUM 9.7   < > 8.8*  AST 29  --   --   ALT 24  --   --   ALKPHOS 50  --   --   BILITOT  0.1*  --   --    < > = values in this interval not displayed.    Cardiac Enzymes No results for input(s): TROPONINI in the last 168 hours.  Microbiology Results  Results for orders placed or performed during the hospital encounter of 01/09/19  SARS Coronavirus 2 (CEPHEID - Performed in [facility] hospital lab), Hosp Order     Status: None   Collection Time: 01/09/19  8:49 PM   Specimen: Nasopharyngeal Swab  Result Value Ref Range Status   SARS Coronavirus 2 NEGATIVE NEGATIVE Final    Comment: (NOTE) If result is NEGATIVE SARS-CoV-2 target nucleic acids are NOT DETECTED. The SARS-CoV-2 RNA is generally detectable in upper and lower  respiratory specimens during the acute phase of infection. The lowest  concentration of SARS-CoV-2 viral copies this assay can detect is 250  copies / mL. A negative result does not preclude SARS-CoV-2 infection  and should not be used as the sole basis for treatment or other  patient management decisions.  A negative result may occur with  improper specimen collection / handling, submission of specimen other  than nasopharyngeal swab, presence of viral mutation(s) within the  areas targeted by this assay, and inadequate number of viral copies  (<250 copies / mL). A negative result must be combined with clinical  observations, patient history, and epidemiological information. If result is POSITIVE SARS-CoV-2 target nucleic acids are DETECTED. The SARS-CoV-2 RNA is generally detectable in upper and lower  respiratory specimens dur ing the acute phase of infection.  Positive  results are indicative of active infection with SARS-CoV-2.  Clinical  correlation with patient history and other diagnostic information is  necessary to determine patient infection status.  Positive results do  not rule out bacterial infection or co-infection with other viruses. If result is PRESUMPTIVE POSTIVE SARS-CoV-2 nucleic acids MAY BE PRESENT.   A presumptive positive  result was obtained on the submitted specimen  and confirmed on repeat testing.  While 2019 novel coronavirus  (SARS-CoV-2) nucleic acids may be present in the submitted sample  additional confirmatory testing may be necessary for epidemiological  and / or clinical management purposes  to differentiate between  SARS-CoV-2 and other Sarbecovirus currently known to infect humans.  If clinically indicated additional testing with an alternate test  methodology [phone]) is advised. The SARS-CoV-2 RNA is generally  detectable in upper and lower respiratory sp ecimens during the acute  phase  of infection. The expected result is Negative. Fact Sheet for Patients:  BoilerBrush.com.cyhttps://www.fda.gov/media/136312/download Fact Sheet for Healthcare Providers: https://pope.com/https://www.fda.gov/media/136313/download This test is not yet approved or cleared by the [place] FDA and has been authorized for detection and/or diagnosis of SARS-CoV-2 by FDA under an Emergency Use Authorization (EUA).  This EUA will remain in effect (meaning this test can be used) for the duration of the COVID-19 declaration under Section 564(b)(1) of the Act, 21 U.S.C. section 360bbb-3(b)(1), unless the authorization is terminated or revoked sooner. Performed at [facility], [address] [place], [place] [address]   MRSA PCR Screening     Status: None   Collection Time: 01/10/19 12:24 AM   Specimen: Nasopharyngeal  Result Value Ref Range Status   MRSA by PCR NEGATIVE NEGATIVE Final    Comment:        The GeneXpert MRSA Assay (FDA approved for NASAL specimens only), is one component of a comprehensive MRSA colonization surveillance program. It is not intended to diagnose MRSA infection nor to guide or monitor treatment for MRSA infections. Performed at [facility], [address] [place], [place] [address]     RADIOLOGY:  No results found.  EKG:   Orders placed or performed during the hospital  encounter of 01/09/19  . ED EKG  . ED EKG      Management plans discussed with the patient, family and they are in agreement.  CODE STATUS:     Code Status Orders  (From admission, onward)         Start     Ordered   01/10/19 0023  Do not attempt resuscitation (DNR)  Continuous    Question Answer Comment  In the event of cardiac or respiratory ARREST Do not call a "code blue"   In the event of cardiac or respiratory ARREST Do not perform Intubation, CPR, defibrillation or ACLS   In the event of cardiac or respiratory ARREST Use medication by any route, position, wound care, and other measures to relive pain and suffering. May use oxygen, suction and manual treatment of airway obstruction as needed for comfort.   Comments nurse may pronounce      01/10/19 0022        Code Status History    Date Active Date Inactive Code Status Order ID Comments User Context   08/29/2018 0200 08/29/2018 1117 DNR [id number]  Loleta RoseForbach, Cory, MD ED   05/02/2017 0849 05/08/2017 2002 DNR [id number]  Alford HighlandWieting, Richard, MD ED   Advance Care Planning Activity    Advance Directive Documentation     Most Recent Value  Type of Advance Directive  Out of facility DNR (pink MOST or yellow form)  Pre-existing out of facility DNR order (yellow form or pink MOST form)  Yellow form placed in chart (order not valid for inpatient use)  "MOST" Form in Place?  -      TOTAL TIME TAKING CARE OF THIS PATIENT: 35 minutes.    Altamese DillingVaibhavkumar Lyan Holck M.D on 01/11/2019 at 2:06 PM  Between 7am to 6pm - Pager - [phone]  After 6pm go to [email or web address] - password [facility] [email or web address]  [facility]  Office  [phone]  CC: Primary care physician; Jalene MulletBryan, Leslie Jane, MD   Note: This dictation was prepared with Dragon dictation along with smaller phrase technology. Any transcriptional errors that result from this process are unintentional.

## 2019-01-11 NOTE — TOC Initial Note (Signed)
Transition of Care [facility](TOC) - Initial/Assessment Note    Patient Details  Name: Seth FasterRobert Harrison MRN: [id number] Date of Birth: 08/26/1940  Transition of Care [facility](TOC) CM/SW Contact:    Barrie Dunkereliliah J Breken Nazari, RN Phone Number: 01/11/2019, 11:28 AM  Clinical Narrative:                 Spoke with Elliot CousinMarlette Golden from [facility] group home, the patient is confused Marlette stated that the patient will be going back to the group home at DC and she will be providing transportation she [facility] the [facility] I called Elnita Maxwellheryl with [facility] and set it up, she stated that they will see the patient tomorrow if he does DC today, I provided Marlette's phone number to contact [facility] for a time to come. The nurse is to call Marlette when Patient is ready to DC for transportation  Expected Discharge Plan: Home w Home Health Services Barriers to Discharge: Barriers Resolved   Patient Goals and CMS Choice Patient states their goals for this hospitalization and ongoing recovery are:: go back to group home CMS Medicare.gov Compare Post Acute Care list provided to:: Patient Represenative (must comment)(Marlette Renette ButtersGolden)    Expected Discharge Plan and Services Expected Discharge Plan: Home w Home Health Services   Discharge Planning Services: CM Consult   Living arrangements for the past 2 months: Group Home                 DME Arranged: N/A         HH Arranged: PT [facility] Date HH Agency Contacted: 01/11/19 Time HH Agency Contacted: 1127 [facility]: Elnita Maxwellheryl  Prior Living Arrangements/Services Living arrangements for the past 2 months: Group Home Lives with:: Facility Resident Patient language and need for interpreter reviewed:: No Do you feel safe going back to the place where you live?: Yes      Need for Family Participation in Patient Care: No (Comment) Care giver support system in place?: Yes  (comment) Current home services: DME(Rolling walker) Criminal Activity/Legal Involvement Pertinent to Current Situation/Hospitalization: No - Comment as needed  Activities of Daily Living Home Assistive Devices/Equipment: None ADL Screening (condition at time of admission) Patient's cognitive ability adequate to safely complete daily activities?: No Is the patient deaf or have difficulty hearing?: No Does the patient have difficulty seeing, even when wearing glasses/contacts?: No Does the patient have difficulty concentrating, remembering, or making decisions?: Yes Patient able to express need for assistance with ADLs?: No Does the patient have difficulty dressing or bathing?: Yes Independently performs ADLs?: No Communication: Independent Dressing (OT): Needs assistance Does the patient have difficulty walking or climbing stairs?: Yes Weakness of Legs: Both Weakness of Arms/Hands: Both  Permission Sought/Granted   Permission granted to share information with : Yes, Verbal Permission Granted              Emotional Assessment Appearance:: Appears stated age Attitude/Demeanor/Rapport: Engaged Affect (typically observed): Appropriate, Accepting Orientation: : Oriented to Self, Oriented to Situation Alcohol / Substance Use: Not Applicable Psych Involvement: No (comment)  Admission diagnosis:  Weakness [R53.1] Renal insufficiency [N28.9] Acute on chronic renal failure (HCC) [N17.9, N18.9] Patient Active Problem List   Diagnosis Date Noted  . Acute on chronic renal failure (HCC) 01/09/2019  . SVT (supraventricular tachycardia) (HCC) 05/02/2017   PCP:  Jalene MulletBryan, Leslie Jane, MD Pharmacy:   [facility] [address]  [place] [address] Phone: [phone] Fax: [phone]     Social Determinants of Health (SDOH) Interventions    Readmission Risk Interventions No flowsheet data found.

## 2019-01-11 NOTE — Progress Notes (Signed)
Pt ready for discharge home today per MD. Patient assessment unchanged from this morning. Reviewed discharge instructions and prescriptions with pt and Marlette Golden from group home; all questions answered and Marlette verbalized understanding. PIV removed, VSS. Pt will be assisted to car when ride from group home arrives.  Ethelda Chick

## 2019-08-30 IMAGING — CT CT HEAD W/O CM
3 series · 15 of 47 positions shown, 18 images · non-contrast
Comparison: 07/19/2016

CLINICAL DATA: Altered mental status since yesterday.

EXAM:
CT HEAD WITHOUT CONTRAST
TECHNIQUE: Contiguous axial images were obtained from the base of the skull
through the vertex without intravenous contrast.

[Series 3: head wo · axial · 0.45mm/px · z∈[-73,+57]mm · 9 of 32 slices shown, 12 images]
[im 3/32  brain]
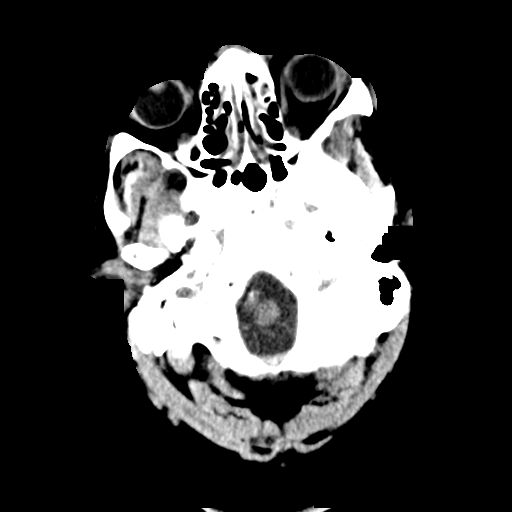
[im 3/32  bone]
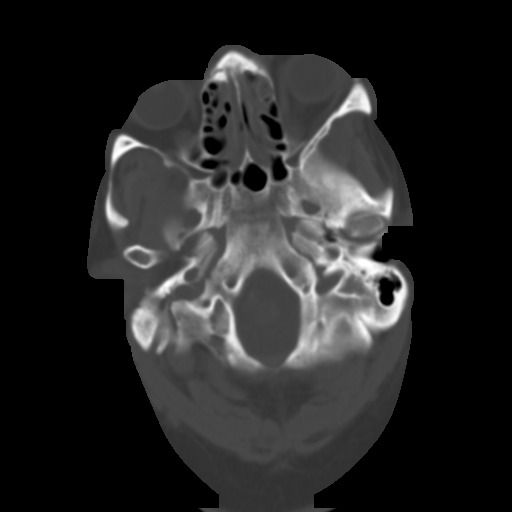
[im 6/32  brain]
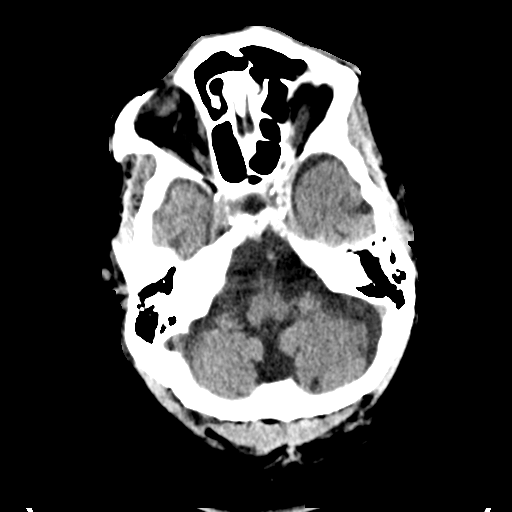
[im 9/32  brain]
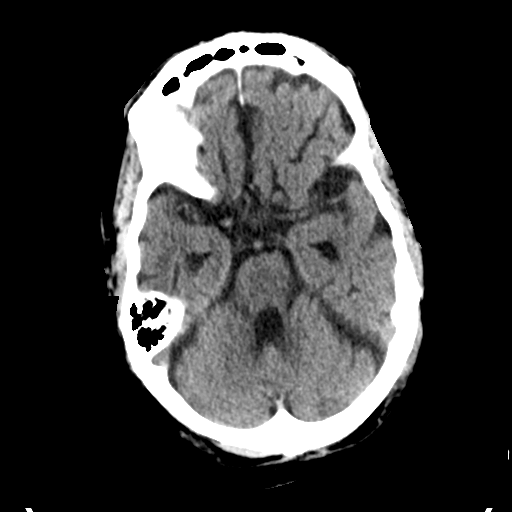
[im 12/32  brain]
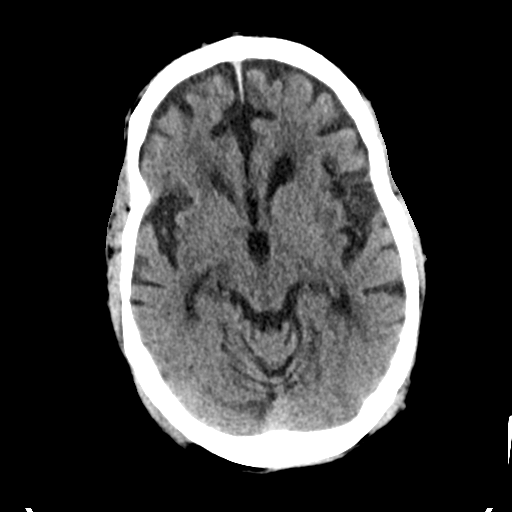
[im 17/32  brain]
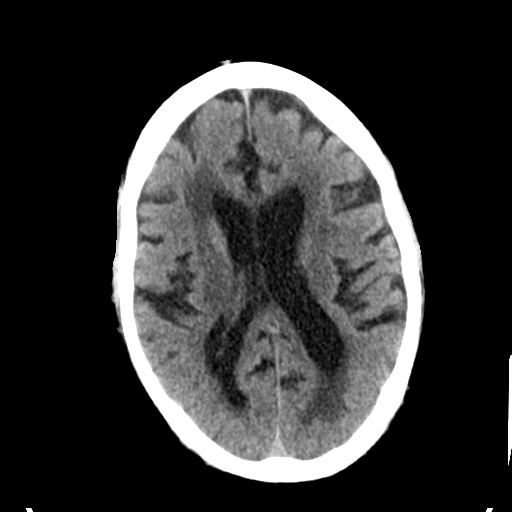
[im 17/32  bone]
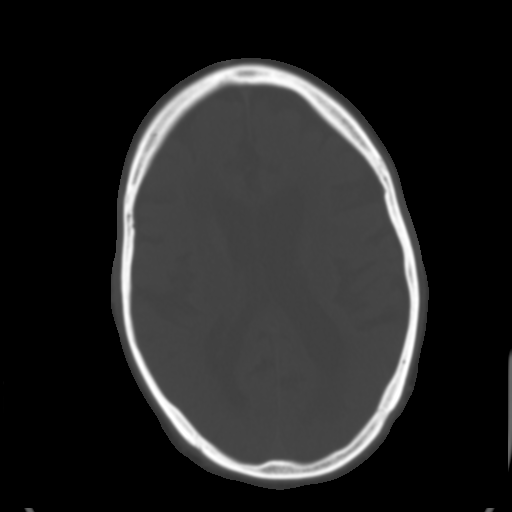
[im 20/32  brain]
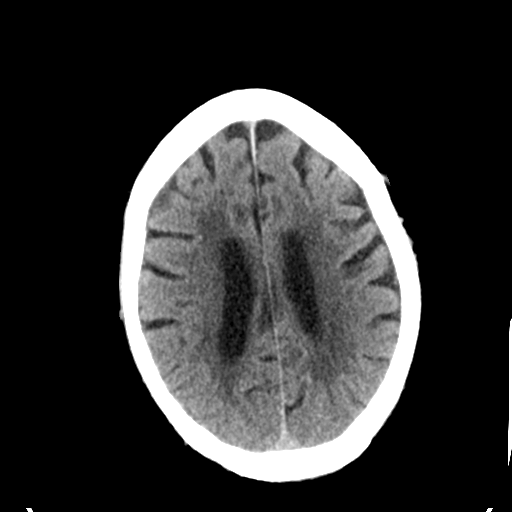
[im 23/32  brain]
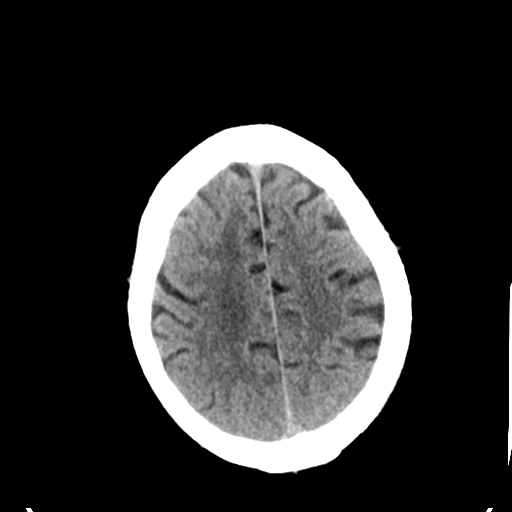
[im 26/32  brain]
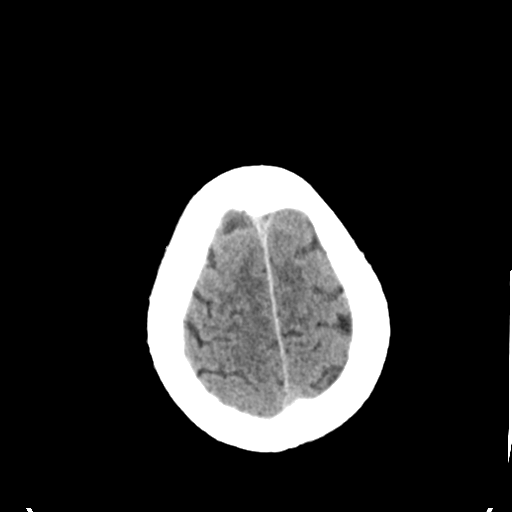
[im 29/32  brain]
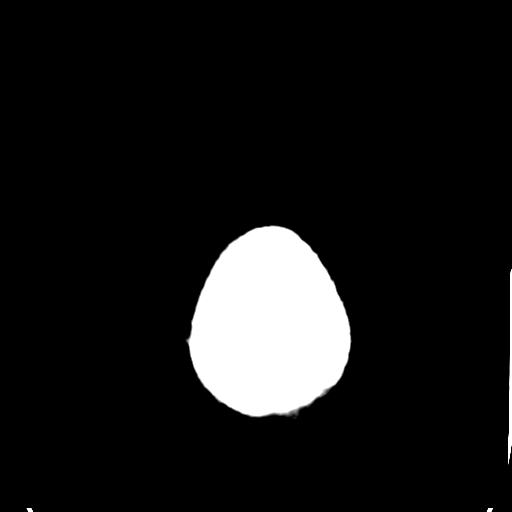
[im 29/32  bone]
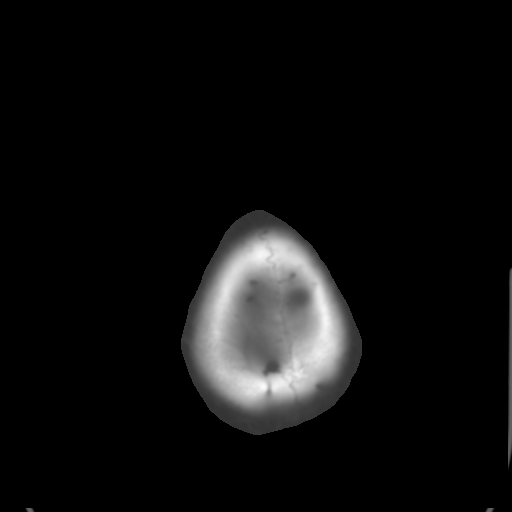

[Series 4: coronal soft tissue · coronal · 0.29mm/px · 3 of 67 slices shown]
[im 23/67  brain]
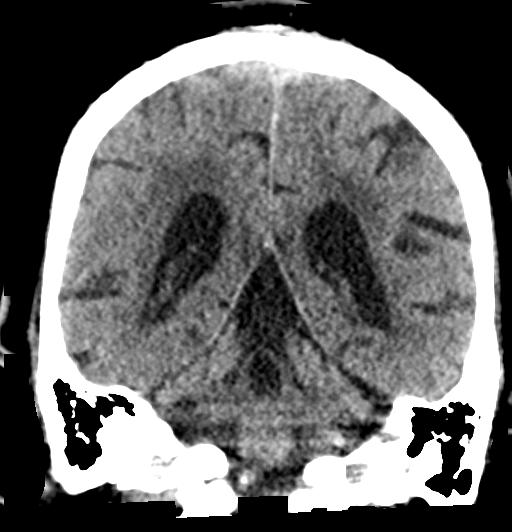
[im 30/67  brain]
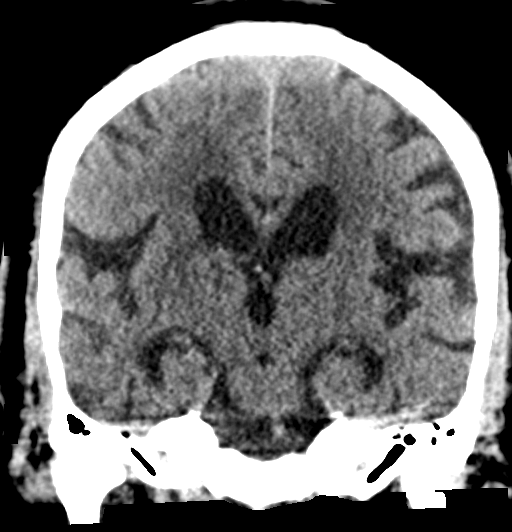
[im 37/67  brain]
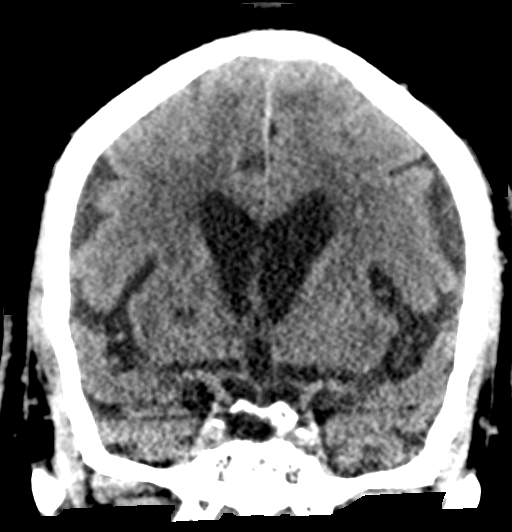

[Series 5: sagittal soft tissue · sagittal · 0.29mm/px · 3 of 51 slices shown]
[im 17/51  brain]
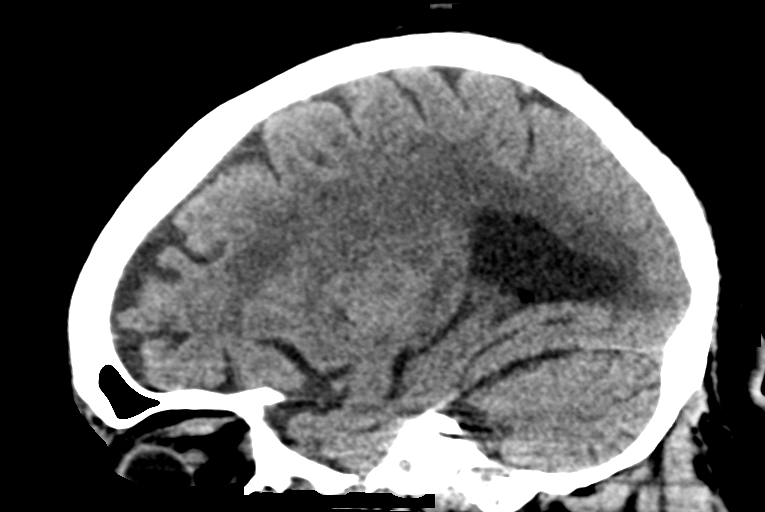
[im 26/51  brain]
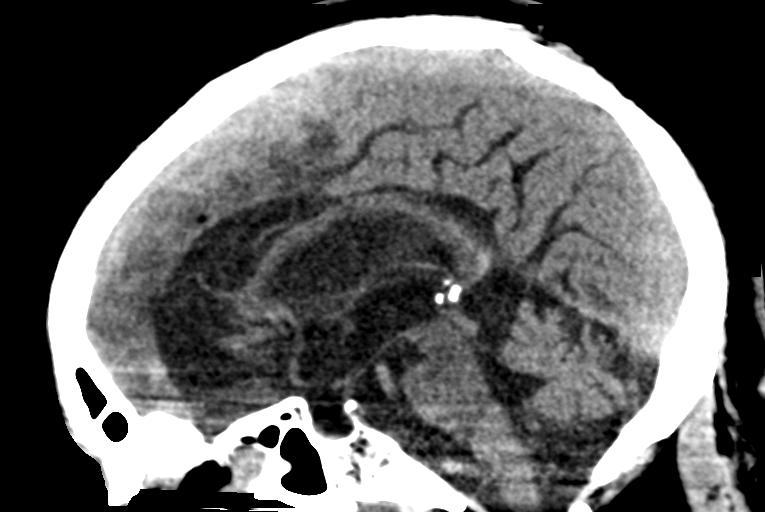
[im 34/51  brain]
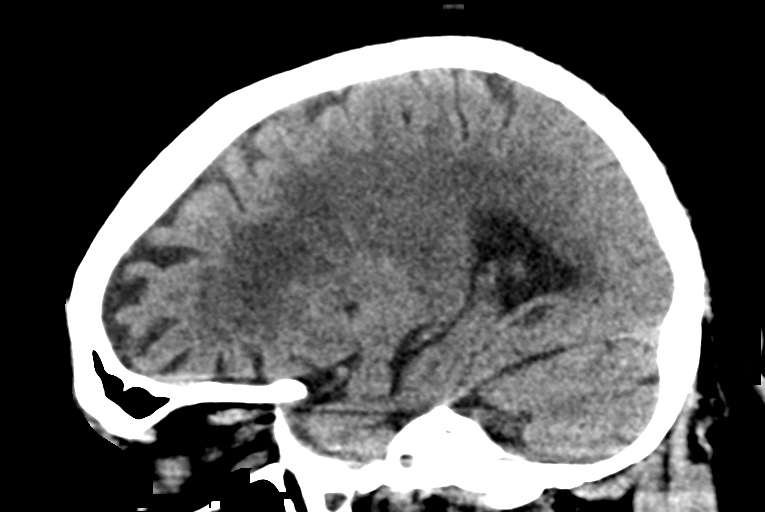

[15 of 47 positions shown; findings below may reference images not displayed]

FINDINGS: Brain: No evidence of acute infarction, hemorrhage, hydrocephalus,
extra-axial collection or mass lesion/mass effect. Small remote left
cerebellar infarct. Patchy low-density in the bilateral deep gray
nuclei and deep white matter tracts attributed to advanced chronic
small vessel ischemia. Generalized atrophy without specific pattern.
Patient has history of dementia.

Vascular: Atherosclerotic calcification.  No hyperdense vessel.

Skull: Chronic posttraumatic deformity of the nasal arch, minimally
covered today.

Sinuses/Orbits: No acute finding.
IMPRESSION: 1. No acute finding or change from July 2016.
2. Atrophy and chronic small vessel ischemic injury.

## 2019-11-06 ENCOUNTER — Encounter: Payer: Self-pay | Admitting: Emergency Medicine

## 2019-11-06 ENCOUNTER — Other Ambulatory Visit: Payer: Self-pay

## 2019-11-06 DIAGNOSIS — R040 Epistaxis: Secondary | ICD-10-CM | POA: Insufficient documentation

## 2019-11-06 DIAGNOSIS — F17210 Nicotine dependence, cigarettes, uncomplicated: Secondary | ICD-10-CM | POA: Diagnosis not present

## 2019-11-06 DIAGNOSIS — I10 Essential (primary) hypertension: Secondary | ICD-10-CM | POA: Insufficient documentation

## 2019-11-06 DIAGNOSIS — Z79899 Other long term (current) drug therapy: Secondary | ICD-10-CM | POA: Insufficient documentation

## 2019-11-06 DIAGNOSIS — Z7982 Long term (current) use of aspirin: Secondary | ICD-10-CM | POA: Insufficient documentation

## 2019-11-06 LAB — CBC WITH DIFFERENTIAL/PLATELET
Abs Immature Granulocytes: 0.01 10*3/uL (ref 0.00–0.07)
Basophils Absolute: 0.1 10*3/uL (ref 0.0–0.1)
Basophils Relative: 1 %
Eosinophils Absolute: 0.1 10*3/uL (ref 0.0–0.5)
Eosinophils Relative: 1 %
HCT: 38.4 % — ABNORMAL LOW (ref 39.0–52.0)
Hemoglobin: 12.4 g/dL — ABNORMAL LOW (ref 13.0–17.0)
Immature Granulocytes: 0 %
Lymphocytes Relative: 50 %
Lymphs Abs: 3.2 10*3/uL (ref 0.7–4.0)
MCH: 27.1 pg (ref 26.0–34.0)
MCHC: 32.3 g/dL (ref 30.0–36.0)
MCV: 84 fL (ref 80.0–100.0)
Monocytes Absolute: 0.7 10*3/uL (ref 0.1–1.0)
Monocytes Relative: 11 %
Neutro Abs: 2.4 10*3/uL (ref 1.7–7.7)
Neutrophils Relative %: 37 %
Platelets: 247 10*3/uL (ref 150–400)
RBC: 4.57 MIL/uL (ref 4.22–5.81)
RDW: 14 % (ref 11.5–15.5)
WBC: 6.5 10*3/uL (ref 4.0–10.5)
nRBC: 0 % (ref 0.0–0.2)

## 2019-11-06 LAB — BASIC METABOLIC PANEL
Anion gap: 7 (ref 5–15)
BUN: 25 mg/dL — ABNORMAL HIGH (ref 8–23)
CO2: 21 mmol/L — ABNORMAL LOW (ref 22–32)
Calcium: 8.5 mg/dL — ABNORMAL LOW (ref 8.9–10.3)
Chloride: 112 mmol/L — ABNORMAL HIGH (ref 98–111)
Creatinine, Ser: 1.83 mg/dL — ABNORMAL HIGH (ref 0.61–1.24)
GFR calc Af Amer: 40 mL/min — ABNORMAL LOW (ref >60)
GFR calc non Af Amer: 35 mL/min — ABNORMAL LOW (ref >60)
Glucose, Bld: 100 mg/dL — ABNORMAL HIGH (ref 70–99)
Potassium: 3.3 mmol/L — ABNORMAL LOW (ref 3.5–5.1)
Sodium: 140 mmol/L (ref 135–145)

## 2019-11-06 MED ORDER — OXYMETAZOLINE HCL 0.05 % NA SOLN
1.0000 | Freq: Once | NASAL | Status: DC
  Filled 2019-11-06: qty 30

## 2019-11-06 NOTE — ED Triage Notes (Addendum)
Patient presents to Emergency Department via [facility] from [facility] on [address] with complaints of nose bleed approx 2000.  No evidence of blood thinners, no MAR from facility, pt reports hx of HTN but has never had nose bleed before.  Per EMS some cognitive impairment  Pt denies fall or head strike or pain  Phone number for facility is [phone]  Face appears with minor amount of dried blood near lips and bleeding has stopped without bandaging

## 2019-11-06 NOTE — ED Notes (Signed)
Patient to waiting room via wheelchair by EMS.  Per EMS patient with nosebleed that lasted approximately 10 minutes.  Initial BP on EMS arrival was 140/110 but down to 138/70.  Temp 98.5 (oral), hr 70 (sinus rhythm on 12-lead), pulse oxi 98% on room air, cbg 120.  Patient with IV to right hand via 20 g.

## 2019-11-07 ENCOUNTER — Emergency Department
Admission: EM | Admit: 2019-11-07 | Discharge: 2019-11-07 | Disposition: A | Discharge status: Home / Self Care | Payer: No Typology Code available for payment source | Attending: Emergency Medicine | Admitting: Emergency Medicine

## 2019-11-07 DIAGNOSIS — R040 Epistaxis: Secondary | ICD-10-CM

## 2019-11-07 NOTE — ED Notes (Signed)
RN attempted to arrange transport for pt back to facility at this time. Unable to get the owner on the phone, but was able to speak with the over night attendant that supplied Seth Harrison's number [phone]. This RN was not able to get in contact with the owner of the facility at this time.

## 2019-11-07 NOTE — Discharge Instructions (Addendum)
If your nose bleeds again, apply nasal clamp and ice to the bridge of your nose.  Tilt your head down so you do not swallow the blood.  If the nose is still bleeding after 20 minutes, proceed to the ER.  Turn to the ER for worsening symptoms, persistent vomiting, difficulty breathing or other concerns.

## 2019-11-07 NOTE — ED Provider Notes (Signed)
[facility] Emergency Department Provider Note   ____________________________________________   First MD Initiated Contact with Patient 11/07/19 0245     (approximate)  I have reviewed the triage vital signs and the nursing notes.   HISTORY  Chief Complaint Epistaxis    HPI Seth Harrison is a 79 y.o. male brought to the ED via EMS from group home with a chief complaint of nosebleed at approximately 8 PM.  Patient does not have a history of nosebleed, does not take anticoagulants, did not have fall/injury/trauma.  Patient does report history of hypertension and does take antihypertensives.  States he had minor bleeding from both nostrils lasting approximately 10 minutes.  Denies associated chest pain, shortness of breath, abdominal pain, nausea, vomiting or dizziness.       Past Medical History:  Diagnosis Date  . Dementia (HCC)   . Depression   . GERD (gastroesophageal reflux disease)   . Hypertension     Patient Active Problem List   Diagnosis Date Noted  . Acute on chronic renal failure (HCC) 01/09/2019  . SVT (supraventricular tachycardia) (HCC) 05/02/2017    Past Surgical History:  Procedure Laterality Date  . FACIAL COSMETIC SURGERY      Prior to Admission medications   Medication Sig Start Date End Date Taking? Authorizing Provider  acetaminophen (TYLENOL) 650 MG CR tablet Take 650 mg by mouth every 8 (eight) hours as needed for pain.    [provider]  amLODipine (NORVASC) 10 MG tablet Take 1 tablet (10 mg total) daily by mouth. 05/09/17   Milagros Loll, MD  aspirin 325 MG tablet Take 325 mg by mouth daily.    [provider]  cholecalciferol (VITAMIN D) 1000 units tablet Take 1,000 Units by mouth daily.    [provider]  donepezil (ARICEPT) 10 MG tablet Take 10 mg by mouth at bedtime.    [provider]  feeding supplement, ENSURE ENLIVE, (ENSURE ENLIVE) LIQD Take 237 mLs by mouth 2 (two) times  daily between meals. 01/11/19   Altamese Dilling, MD  hydrALAZINE (APRESOLINE) 10 MG tablet Take 1 tablet (10 mg total) by mouth every 8 (eight) hours. 01/11/19   Altamese Dilling, MD  isosorbide mononitrate (IMDUR) 60 MG 24 hr tablet Take 1 tablet (60 mg total) daily by mouth. 05/08/17   Milagros Loll, MD  mirtazapine (REMERON) 15 MG tablet Take 15 mg by mouth at bedtime.    [provider]  Multiple Vitamins-Minerals (THEREMS-M) TABS Take 1 tablet by mouth daily.    [provider]  omeprazole (PRILOSEC) 40 MG capsule Take 40 mg by mouth daily.    [provider]  vitamin B-12 (CYANOCOBALAMIN) 1000 MCG tablet Take 1,000 mcg by mouth daily.    [provider]    Allergies Patient has no known allergies.  Family History  Problem Relation Age of Onset  . Hypertension Mother     Social History Social History   Tobacco Use  . Smoking status: Current Every Day Smoker    Packs/day: 1.00    Types: Cigarettes  . Smokeless tobacco: Never Used  Substance Use Topics  . Alcohol use: Yes    Comment: occasional  . Drug use: No    Review of Systems  Constitutional: No fever/chills Eyes: No visual changes. ENT: Positive for nosebleed.  No sore throat. Cardiovascular: Denies chest pain. Respiratory: Denies shortness of breath. Gastrointestinal: No abdominal pain.  No nausea, no vomiting.  No diarrhea.  No constipation. Genitourinary: Negative  for dysuria. Musculoskeletal: Negative for back pain. Skin: Negative for rash. Neurological: Negative for headaches, focal weakness or numbness.   ____________________________________________   PHYSICAL EXAM:  VITAL SIGNS: ED Triage Vitals  Enc Vitals Group     BP 11/06/19 2211 (!) 144/80     Pulse Rate 11/06/19 2211 72     Resp 11/06/19 2211 18     Temp 11/06/19 2211 98.2 F (36.8 C)     Temp Source 11/06/19 2211 Oral     SpO2 11/06/19 2211 94 %     Weight 11/06/19 2213 195 lb (88.5 kg)       Height 11/06/19 2213 5\' 9"  (1.753 m)     Head Circumference --      Peak Flow --      Pain Score 11/06/19 2213 0     Pain Loc --      Pain Edu? --      Excl. in GC? --     Constitutional: Alert and oriented. Well appearing and in no acute distress. Eyes: Conjunctivae are normal. PERRL. EOMI. Head: Atraumatic. Nose: No active bleeding from either nares.  No blood clot or source of bleeding from either nares.. Mouth/Throat: Mucous membranes are moist.  No bleeding to posterior oropharynx. Neck: No stridor.   Cardiovascular: Normal rate, regular rhythm. Grossly normal heart sounds.  Good peripheral circulation. Respiratory: Normal respiratory effort.  No retractions. Lungs CTAB. Gastrointestinal: Soft and nontender. No distention. No abdominal bruits. No CVA tenderness. Musculoskeletal: No lower extremity tenderness nor edema.  No joint effusions. Neurologic:  Normal speech and language. No gross focal neurologic deficits are appreciated. No gait instability. Skin:  Skin is warm, dry and intact. No rash noted. Psychiatric: Mood and affect are normal. Speech and behavior are normal.  ____________________________________________   LABS (all labs ordered are listed, but only abnormal results are displayed)  Labs Reviewed  CBC WITH DIFFERENTIAL/PLATELET - Abnormal; Notable for the following components:      Result Value   Hemoglobin 12.4 (*)    HCT 38.4 (*)    All other components within normal limits  BASIC METABOLIC PANEL - Abnormal; Notable for the following components:   Potassium 3.3 (*)    Chloride 112 (*)    CO2 21 (*)    Glucose, Bld 100 (*)    BUN 25 (*)    Creatinine, Ser 1.83 (*)    Calcium 8.5 (*)    GFR calc non Af Amer 35 (*)    GFR calc Af Amer 40 (*)    All other components within normal limits   ____________________________________________  EKG  None ____________________________________________  RADIOLOGY  ED MD interpretation: None  Official  radiology report(s): No results found.  ____________________________________________   PROCEDURES  Procedure(s) performed (including Critical Care):  Procedures   ____________________________________________   INITIAL IMPRESSION / ASSESSMENT AND PLAN / ED COURSE  As part of my medical decision making, I reviewed the following data within the electronic MEDICAL RECORD NUMBER Nursing notes reviewed and incorporated, Labs reviewed, Old chart reviewed and Notes from prior ED visits     Jiovanny Burdell was evaluated in Emergency Department on 11/07/2019 for the symptoms described in the history of present illness. He was evaluated in the context of the global COVID-19 pandemic, which necessitated consideration that the patient might be at risk for infection with the SARS-CoV-2 virus that causes COVID-19. Institutional protocols and algorithms that pertain to the evaluation of patients at risk for COVID-19 are in a state  of rapid change based on information released by regulatory bodies including the CDC and federal and state organizations. These policies and algorithms were followed during the patient's care in the ED.    79 year old male presenting for a 10-minute bilateral nosebleed at approximately 8 PM.  There has been no rebleeding since; patient has been in the ED for 5 hours.  Laboratory results demonstrate stable H/H and improved renal function from prior.  Will discharge home with nasal clamp and instructions should his nosebleeds again.  Strict return precautions given.  Patient verbalizes understanding and agrees with plan of care.      ____________________________________________   FINAL CLINICAL IMPRESSION(S) / ED DIAGNOSES  Final diagnoses:  Epistaxis     ED Discharge Orders    None       Note:  This document was prepared using Dragon voice recognition software and may include unintentional dictation errors.   Paulette Blanch, MD 11/07/19 [phone]

## 2019-12-26 ENCOUNTER — Emergency Department
Admission: EM | Admit: 2019-12-26 | Discharge: 2019-12-30 | Disposition: E | Payer: No Typology Code available for payment source | Attending: Emergency Medicine | Admitting: Emergency Medicine

## 2019-12-26 ENCOUNTER — Emergency Department: Payer: No Typology Code available for payment source

## 2019-12-26 DIAGNOSIS — I10 Essential (primary) hypertension: Secondary | ICD-10-CM | POA: Insufficient documentation

## 2019-12-26 DIAGNOSIS — I469 Cardiac arrest, cause unspecified: Secondary | ICD-10-CM | POA: Diagnosis present

## 2019-12-26 DIAGNOSIS — F17210 Nicotine dependence, cigarettes, uncomplicated: Secondary | ICD-10-CM | POA: Insufficient documentation

## 2019-12-26 DIAGNOSIS — Z66 Do not resuscitate: Secondary | ICD-10-CM | POA: Diagnosis not present

## 2019-12-26 LAB — CBC WITH DIFFERENTIAL/PLATELET
Abs Immature Granulocytes: 0.21 10*3/uL — ABNORMAL HIGH (ref 0.00–0.07)
Basophils Absolute: 0.1 10*3/uL (ref 0.0–0.1)
Basophils Relative: 1 %
Eosinophils Absolute: 0.1 10*3/uL (ref 0.0–0.5)
Eosinophils Relative: 1 %
HCT: 37.7 % — ABNORMAL LOW (ref 39.0–52.0)
Hemoglobin: 11.6 g/dL — ABNORMAL LOW (ref 13.0–17.0)
Immature Granulocytes: 2 %
Lymphocytes Relative: 52 %
Lymphs Abs: 7.1 10*3/uL — ABNORMAL HIGH (ref 0.7–4.0)
MCH: 27.4 pg (ref 26.0–34.0)
MCHC: 30.8 g/dL (ref 30.0–36.0)
MCV: 88.9 fL (ref 80.0–100.0)
Monocytes Absolute: 0.9 10*3/uL (ref 0.1–1.0)
Monocytes Relative: 7 %
Neutro Abs: 5 10*3/uL (ref 1.7–7.7)
Neutrophils Relative %: 37 %
Platelets: 201 10*3/uL (ref 150–400)
RBC: 4.24 MIL/uL (ref 4.22–5.81)
RDW: 13.8 % (ref 11.5–15.5)
Smear Review: NORMAL
WBC: 13.4 10*3/uL — ABNORMAL HIGH (ref 4.0–10.5)
nRBC: 0.1 % (ref 0.0–0.2)

## 2019-12-26 LAB — COMPREHENSIVE METABOLIC PANEL
ALT: 22 U/L (ref 0–44)
AST: 30 U/L (ref 15–41)
Albumin: 3.7 g/dL (ref 3.5–5.0)
Alkaline Phosphatase: 64 U/L (ref 38–126)
Anion gap: 16 — ABNORMAL HIGH (ref 5–15)
BUN: 23 mg/dL (ref 8–23)
CO2: 19 mmol/L — ABNORMAL LOW (ref 22–32)
Calcium: 8.1 mg/dL — ABNORMAL LOW (ref 8.9–10.3)
Chloride: 105 mmol/L (ref 98–111)
Creatinine, Ser: 2.63 mg/dL — ABNORMAL HIGH (ref 0.61–1.24)
GFR calc Af Amer: 26 mL/min — ABNORMAL LOW (ref >60)
GFR calc non Af Amer: 22 mL/min — ABNORMAL LOW (ref >60)
Glucose, Bld: 231 mg/dL — ABNORMAL HIGH (ref 70–99)
Potassium: 3.2 mmol/L — ABNORMAL LOW (ref 3.5–5.1)
Sodium: 140 mmol/L (ref 135–145)
Total Bilirubin: 0.6 mg/dL (ref 0.3–1.2)
Total Protein: 7.1 g/dL (ref 6.5–8.1)

## 2019-12-26 LAB — LACTIC ACID, PLASMA: Lactic Acid, Venous: 7.2 mmol/L (ref 0.5–1.9)

## 2019-12-26 LAB — TROPONIN I (HIGH SENSITIVITY): Troponin I (High Sensitivity): 92 ng/L — ABNORMAL HIGH (ref <18)

## 2019-12-26 MED ORDER — SODIUM CHLORIDE 0.9 % IV SOLN: 1000.0000 mL | Freq: Once | INTRAVENOUS | Status: DC

## 2019-12-27 ENCOUNTER — Other Ambulatory Visit: Payer: No Typology Code available for payment source

## 2019-12-27 LAB — PATHOLOGIST SMEAR REVIEW

## 2019-12-30 NOTE — Code Documentation (Signed)
  Upon transport to ED bed 25 team at bedside assessing pt and obtaining VS.  Pt pulse sinus tach inittially then began to brady down.  1 mg atropine given at 1734 then patient converted to v-fib and arrested at 1735.  CPR initiated at this time and 1mg  epinephrine administered IVP.  1736. Pulse check and ROSC with sinus tach on monitor and first set of complete VS.  167/95 HR 127, RR 12 via BVM, spo2 56 on 100% oxygen.  Pt intubated by dr. at [phone] with 7.5 tube secured at 23cm at the teeth.  Rt at bedside and place pt on bedside ventilator.  12 lead ekg performed.  Labs sent for analysis.

## 2019-12-30 NOTE — ED Provider Notes (Addendum)
[facility]  Department of Emergency Medicine   Code Blue CONSULT NOTE  Chief Complaint: Cardiac arrest/unresponsive   Level V Caveat: Unresponsive  History of present illness: Patient arrives to the ER status post cardiac arrest.  EMS had placed a King airway, had started chest compressions, gave standard ACLS protocols with return of spontaneous circulation on arrival.  Reportedly after resuscitative efforts were begun, someone from his group home showed up with a DNR.  ROS: Unable to obtain, Level V caveat  Scheduled Meds: Continuous Infusions: . sodium chloride     PRN Meds:. Past Medical History:  Diagnosis Date  . Dementia (HCC)   . Depression   . GERD (gastroesophageal reflux disease)   . Hypertension    Past Surgical History:  Procedure Laterality Date  . FACIAL COSMETIC SURGERY     Social History   Socioeconomic History  . Marital status: Single    Spouse name: Not on file  . Number of children: Not on file  . Years of education: Not on file  . Highest education level: Not on file  Occupational History  . Not on file  Tobacco Use  . Smoking status: Current Every Day Smoker    Packs/day: 1.00    Types: Cigarettes  . Smokeless tobacco: Never Used  Vaping Use  . Vaping Use: Never used  Substance and Sexual Activity  . Alcohol use: Yes    Comment: occasional  . Drug use: No  . Sexual activity: Never  Other Topics Concern  . Not on file  Social History Narrative  . Not on file   Social Determinants of Health   Financial Resource Strain:   . Difficulty of Paying Living Expenses:   Food Insecurity:   . Worried About Programme researcher, broadcasting/film/video in the Last Year:   . Barista in the Last Year:   Transportation Needs:   . Freight forwarder (Medical):   Marland Kitchen Lack of Transportation (Non-Medical):   Physical Activity:   . Days of Exercise per Week:   . Minutes of Exercise per Session:   Stress:   . Feeling of Stress :   Social  Connections:   . Frequency of Communication with Friends and Family:   . Frequency of Social Gatherings with Friends and Family:   . Attends Religious Services:   . Active Member of Clubs or Organizations:   . Attends Banker Meetings:   Marland Kitchen Marital Status:   Intimate Partner Violence:   . Fear of Current or Ex-Partner:   . Emotionally Abused:   Marland Kitchen Physically Abused:   . Sexually Abused:    No Known Allergies  Last set of Vital Signs (not current) Vitals:   02-Jan-2020 1749 02-Jan-2020 1750  BP:    Pulse:    Resp: (!) 21 20  SpO2:        Physical Exam Gen: unresponsive Cardiovascular: pulseless  Resp: apneic. Breath sounds equal bilaterally with bagging via Brooke Dare airway Abd: nondistended  Neuro: GCS 3, unresponsive to pain  HEENT: No blood in posterior pharynx, gag reflex absent  Neck: No crepitus  Musculoskeletal: No deformity  Skin: warm  Procedures  INTUBATION Performed by: Ulice Dash Required items: required blood products, implants, devices, and special equipment available Patient identity confirmed: provided demographic data and hospital-assigned identification number Time out: Immediately prior to procedure a "time out" was called to verify the correct patient, procedure, equipment, support staff and site/side marked as required. Indications: Definitive  airway, post arrest Intubation method: Glide scope Preoxygenation: 100% Sedatives: None Paralytic: None Tube Size: 7.5 cuffed Post-procedure assessment: chest rise and ETCO2 monitor Breath sounds: equal and absent over the epigastrium Tube secured by Respiratory Therapy Patient tolerated the procedure well with no immediate complications.  CRITICAL CARE Performed by: Laurence Aly Total critical care time: 15 Critical care time was exclusive of separately billable procedures and treating other patients. Critical care was necessary to treat or prevent imminent or life-threatening  deterioration. Critical care was time spent personally by me on the following activities: development of treatment plan with patient and/or surrogate as well as nursing, discussions with consultants, evaluation of patient's response to treatment, examination of patient, obtaining history from patient or surrogate, ordering and performing treatments and interventions, ordering and review of laboratory studies, ordering and review of radiographic studies, pulse oximetry and re-evaluation of patient's condition.  EKG: Sinus tachycardia with rate of 142 bpm, repolarization abnormality, normal QT  Cardiopulmonary Resuscitation (CPR) Procedure Note Directed/Performed by: Laurence Aly I personally directed ancillary staff and/or performed CPR in an effort to regain return of spontaneous circulation and to maintain cardiac, neuro and systemic perfusion.    Medical Decision making  Cardiopulmonary arrest, MI, CVA, PE, arrhythmia  Assessment and Plan  Cardiopulmonary arrest, DNR  Patient was status post cardiopulmonary arrest.  On arrival he had return of spontaneous circulation after being intubated with IV access and ACLS drugs.  We continued this process, he did have brief loss of circulation which improved after 1 mg of epinephrine.  ET tube was changed out and he remained stable for about 5 minutes.  He proceeded to bradycardia down and rearrest.  Given his DNR status I attempted to contact his next of kin or any family but was unsuccessful.  Time of death was called at 6 PM.    Earleen Newport, MD Jan 11, 2020 Benedetto Goad, MD 01/11/2020 [phone]

## 2019-12-30 NOTE — Code Documentation (Signed)
Pt HR in the 30s with no palpable pulse, in PEA arrest.  Dr. Mayford Knife called resuscitation efforts and pronounced patient deceased at 61.

## 2019-12-30 NOTE — ED Notes (Signed)
Pt transported to morgue 

## 2019-12-30 NOTE — ED Triage Notes (Signed)
Pt BIBA via stretcher in supine position on backboard with assisted ventilations via BVM with king airway in place.  Pt arrested at care facility and was found in v-fib, converted after  cpr, 1mg  epi and 2 defibrillations. Transferred to ED bed 25 via backboard.  ER team and Dr. at bedside for assessment.  GCS 3T, pupils fixed and dilated, rr assisted via bvm with vomitus around mouth, rhonchi bilaterally, abd soft/round/nontender, skin cool/dry/intact. No obvious signs of trauma noted. Obtaining vs.

## 2019-12-30 NOTE — Code Documentation (Signed)
Pt BP trending down.  1 mg atropine adminstered via IVP per orders.  PT DNR discovered by EMS.  Dr. Mayford Knife made aware.  Pt converts from sinus rhythm to IVR with BP is in low 50s.  Dr. Mayford Knife and team remain at bedside with pt.

## 2019-12-30 DEATH — deceased

## 2020-08-24 IMAGING — CR DG CHEST 2V
2 series · 2 of 2 positions shown · non-contrast
Comparison: 05/02/2017

CLINICAL DATA: Here for SHOB per [REDACTED] report to EMS. Pt
denies any SHOB. Per EMS they were informed he was SHOB last couple
days and [REDACTED] staff noticed wheezing today. Breath sounds
clear without wheezing on auscultation. Hx - HTN, current smoker 1
ppd.

EXAM:
CHEST - 2 VIEW

[chest pa]
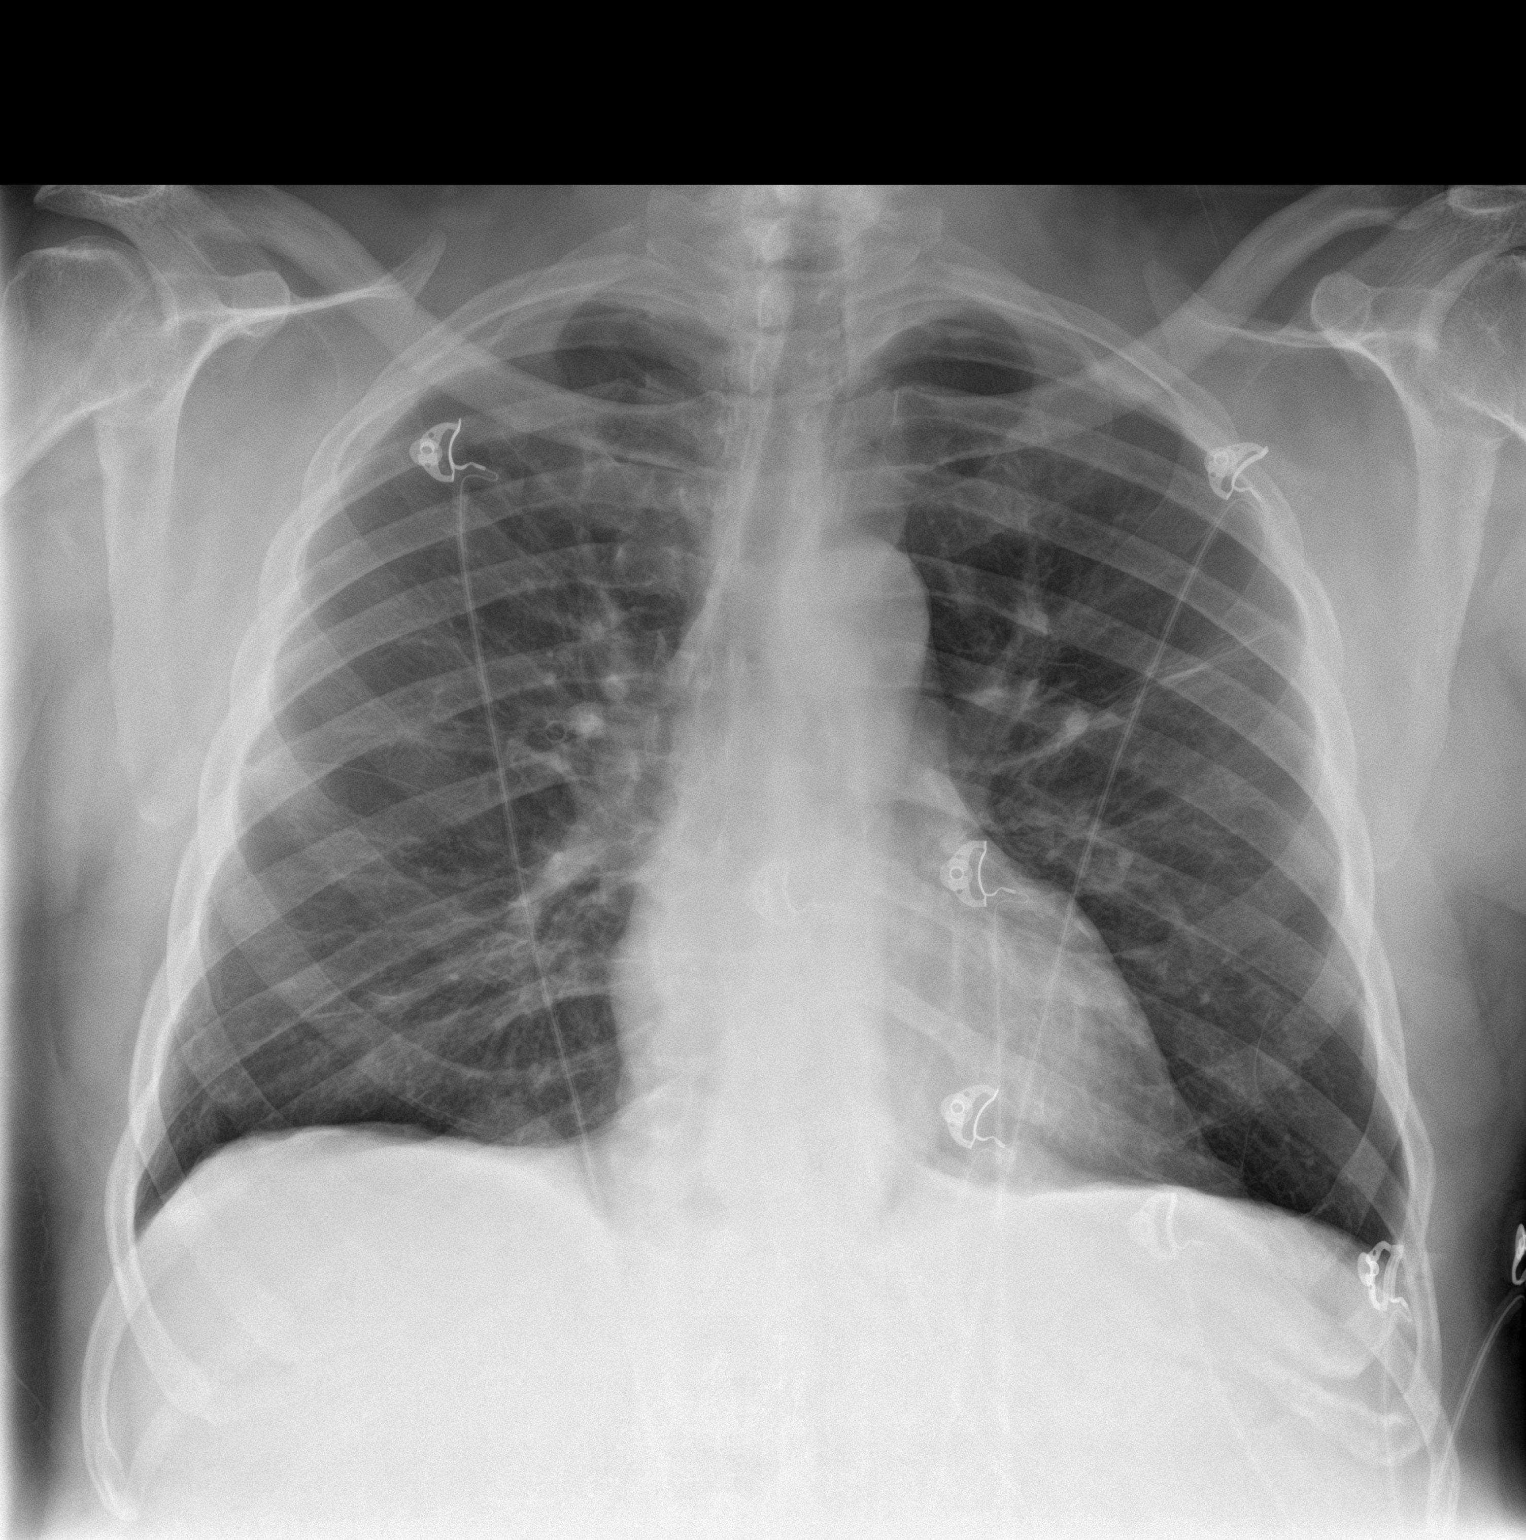

[chest lat]
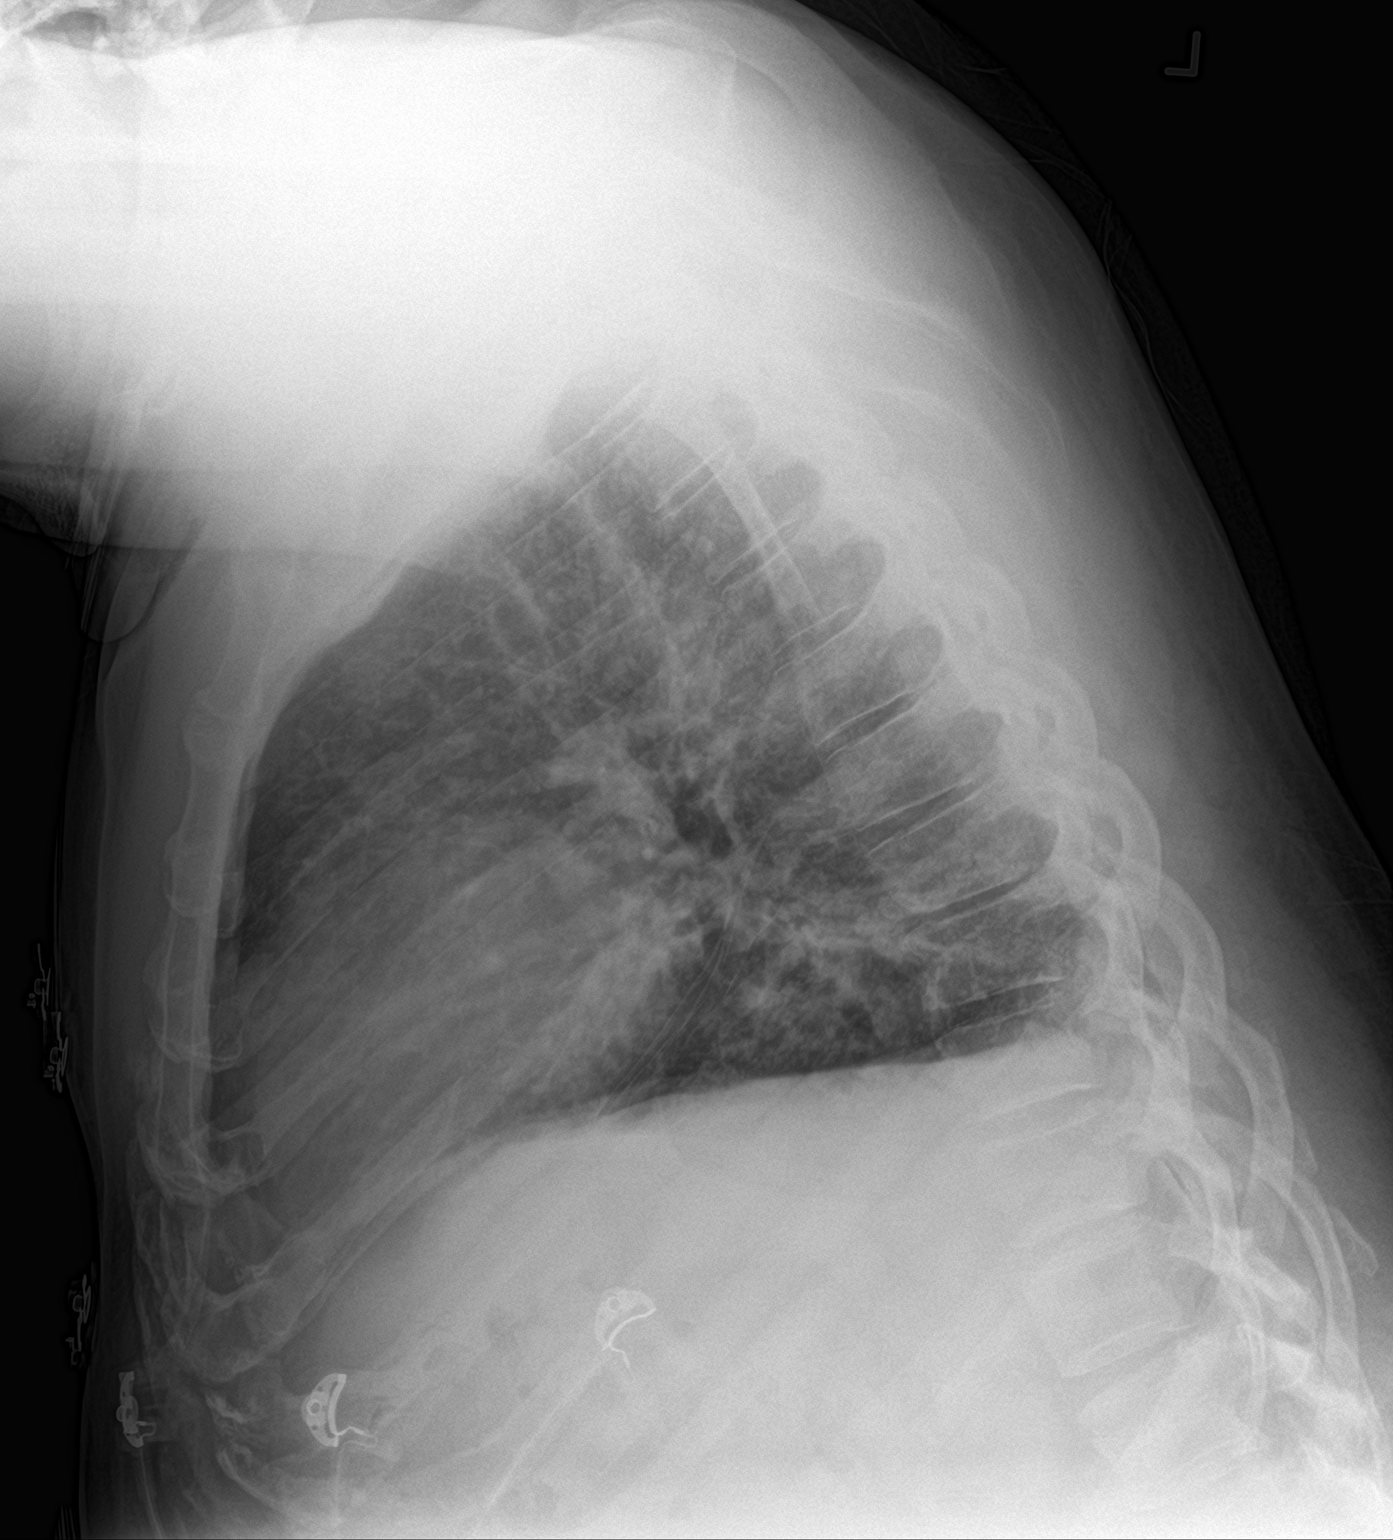

[2 of 2 positions shown; findings below may reference images not displayed]

FINDINGS: Cardiac silhouette is normal in size. No mediastinal or hilar
masses. No evidence of adenopathy. Linear opacity extends laterally
from the left hilum consistent with scarring or subsegmental
atelectasis. Lungs otherwise clear. No pleural effusion or
pneumothorax.

Skeletal structures are intact.
IMPRESSION: No active cardiopulmonary disease.
